# Patient Record
Sex: Female | Born: 1975 | Race: White | Hispanic: No | Smoking: Never smoker
Health system: Southern US, Community
[De-identification: ages and names within clinical notes are randomized; demographics above are authoritative.]

## PROBLEM LIST (undated history)

## (undated) HISTORY — PX: CHOLECYSTECTOMY: SHX55

---

## 2000-09-29 ENCOUNTER — Other Ambulatory Visit: Admission: RE | Admit: 2000-09-29 | Discharge: 2000-09-29 | Payer: Self-pay | Admitting: *Deleted

## 2005-02-20 ENCOUNTER — Inpatient Hospital Stay (HOSPITAL_COMMUNITY): Admission: AD | Admit: 2005-02-20 | Discharge: 2005-02-20 | Payer: Self-pay | Admitting: Obstetrics and Gynecology

## 2005-02-27 ENCOUNTER — Ambulatory Visit (HOSPITAL_COMMUNITY): Admission: RE | Admit: 2005-02-27 | Discharge: 2005-02-27 | Payer: Self-pay | Admitting: *Deleted

## 2005-03-04 ENCOUNTER — Inpatient Hospital Stay (HOSPITAL_COMMUNITY): Admission: AD | Admit: 2005-03-04 | Discharge: 2005-03-04 | Payer: Self-pay | Admitting: *Deleted

## 2005-03-07 ENCOUNTER — Inpatient Hospital Stay (HOSPITAL_COMMUNITY): Admission: AD | Admit: 2005-03-07 | Discharge: 2005-03-09 | Payer: Self-pay | Admitting: Obstetrics and Gynecology

## 2007-08-17 ENCOUNTER — Other Ambulatory Visit: Admission: RE | Admit: 2007-08-17 | Discharge: 2007-08-17 | Payer: Self-pay | Admitting: Obstetrics and Gynecology

## 2008-03-06 ENCOUNTER — Inpatient Hospital Stay (HOSPITAL_COMMUNITY): Admission: AD | Admit: 2008-03-06 | Discharge: 2008-03-06 | Payer: Self-pay | Admitting: Obstetrics and Gynecology

## 2008-03-09 ENCOUNTER — Inpatient Hospital Stay (HOSPITAL_COMMUNITY): Admission: AD | Admit: 2008-03-09 | Discharge: 2008-03-09 | Payer: Self-pay | Admitting: Obstetrics and Gynecology

## 2008-03-10 ENCOUNTER — Inpatient Hospital Stay (HOSPITAL_COMMUNITY): Admission: AD | Admit: 2008-03-10 | Discharge: 2008-03-12 | Payer: Self-pay | Admitting: Obstetrics and Gynecology

## 2008-08-11 HISTORY — PX: LAPAROSCOPIC CHOLECYSTECTOMY: SUR755

## 2009-03-28 ENCOUNTER — Other Ambulatory Visit: Admission: RE | Admit: 2009-03-28 | Discharge: 2009-03-28 | Payer: Self-pay | Admitting: Obstetrics and Gynecology

## 2009-07-31 ENCOUNTER — Inpatient Hospital Stay (HOSPITAL_COMMUNITY): Admission: EM | Admit: 2009-07-31 | Discharge: 2009-08-04 | Payer: Self-pay | Admitting: Emergency Medicine

## 2009-08-01 ENCOUNTER — Encounter (INDEPENDENT_AMBULATORY_CARE_PROVIDER_SITE_OTHER): Payer: Self-pay

## 2010-04-11 ENCOUNTER — Other Ambulatory Visit: Admission: RE | Admit: 2010-04-11 | Discharge: 2010-04-11 | Payer: Self-pay | Admitting: Obstetrics and Gynecology

## 2010-11-09 ENCOUNTER — Inpatient Hospital Stay (HOSPITAL_COMMUNITY)
Admission: AD | Admit: 2010-11-09 | Discharge: 2010-11-11 | DRG: 373 | Disposition: A | Discharge status: Home / Self Care | Payer: BC Managed Care – PPO | Source: Ambulatory Visit | Attending: Obstetrics and Gynecology | Admitting: Obstetrics and Gynecology

## 2010-11-09 ENCOUNTER — Other Ambulatory Visit: Payer: Self-pay | Admitting: Obstetrics and Gynecology

## 2010-11-09 DIAGNOSIS — O99892 Other specified diseases and conditions complicating childbirth: Secondary | ICD-10-CM | POA: Diagnosis present

## 2010-11-09 DIAGNOSIS — Z22330 Carrier of Group B streptococcus: Secondary | ICD-10-CM

## 2010-11-09 LAB — CBC
Hemoglobin: 12.9 g/dL (ref 12.0–15.0)
Platelets: 236 10*3/uL (ref 150–400)
RBC: 4.32 MIL/uL (ref 3.87–5.11)
RDW: 12.9 % (ref 11.5–15.5)

## 2010-11-10 LAB — CBC
HCT: 28.5 % — ABNORMAL LOW (ref 36.0–46.0)
Hemoglobin: 9.3 g/dL — ABNORMAL LOW (ref 12.0–15.0)
Platelets: 186 10*3/uL (ref 150–400)
RBC: 3.21 MIL/uL — ABNORMAL LOW (ref 3.87–5.11)
RDW: 13.2 % (ref 11.5–15.5)
WBC: 10.9 10*3/uL — ABNORMAL HIGH (ref 4.0–10.5)

## 2010-11-10 LAB — RPR: RPR Ser Ql: NONREACTIVE

## 2010-11-11 LAB — COMPREHENSIVE METABOLIC PANEL
ALT: 41 U/L — ABNORMAL HIGH (ref 0–35)
ALT: 749 U/L — ABNORMAL HIGH (ref 0–35)
ALT: 886 U/L — ABNORMAL HIGH (ref 0–35)
AST: 135 U/L — ABNORMAL HIGH (ref 0–37)
AST: 257 U/L — ABNORMAL HIGH (ref 0–37)
AST: 399 U/L — ABNORMAL HIGH (ref 0–37)
AST: 509 U/L — ABNORMAL HIGH (ref 0–37)
AST: 603 U/L — ABNORMAL HIGH (ref 0–37)
AST: 61 U/L — ABNORMAL HIGH (ref 0–37)
AST: 613 U/L — ABNORMAL HIGH (ref 0–37)
Albumin: 3 g/dL — ABNORMAL LOW (ref 3.5–5.2)
Albumin: 3.2 g/dL — ABNORMAL LOW (ref 3.5–5.2)
Albumin: 3.2 g/dL — ABNORMAL LOW (ref 3.5–5.2)
Albumin: 3.4 g/dL — ABNORMAL LOW (ref 3.5–5.2)
Albumin: 3.4 g/dL — ABNORMAL LOW (ref 3.5–5.2)
Albumin: 3.8 g/dL (ref 3.5–5.2)
Alkaline Phosphatase: 114 U/L (ref 39–117)
Alkaline Phosphatase: 130 U/L — ABNORMAL HIGH (ref 39–117)
Alkaline Phosphatase: 86 U/L (ref 39–117)
BUN: 1 mg/dL — ABNORMAL LOW (ref 6–23)
BUN: 12 mg/dL (ref 6–23)
BUN: 2 mg/dL — ABNORMAL LOW (ref 6–23)
BUN: 3 mg/dL — ABNORMAL LOW (ref 6–23)
BUN: <1 mg/dL — ABNORMAL LOW (ref 6–23)
CO2: 27 mEq/L (ref 19–32)
CO2: 27 mEq/L (ref 19–32)
CO2: 29 mEq/L (ref 19–32)
Calcium: 8.4 mg/dL (ref 8.4–10.5)
Calcium: 8.4 mg/dL (ref 8.4–10.5)
Calcium: 8.8 mg/dL (ref 8.4–10.5)
Calcium: 8.9 mg/dL (ref 8.4–10.5)
Chloride: 105 mEq/L (ref 96–112)
Chloride: 108 mEq/L (ref 96–112)
Chloride: 110 mEq/L (ref 96–112)
Creatinine, Ser: 0.7 mg/dL (ref 0.4–1.2)
Creatinine, Ser: 0.77 mg/dL (ref 0.4–1.2)
Creatinine, Ser: 0.77 mg/dL (ref 0.4–1.2)
Creatinine, Ser: 0.82 mg/dL (ref 0.4–1.2)
Creatinine, Ser: 0.97 mg/dL (ref 0.4–1.2)
GFR calc Af Amer: >60 mL/min (ref >60)
GFR calc Af Amer: >60 mL/min (ref >60)
GFR calc Af Amer: >60 mL/min (ref >60)
GFR calc Af Amer: >60 mL/min (ref >60)
GFR calc non Af Amer: >60 mL/min (ref >60)
GFR calc non Af Amer: >60 mL/min (ref >60)
Potassium: 3.7 mEq/L (ref 3.5–5.1)
Potassium: 4 mEq/L (ref 3.5–5.1)
Potassium: 4.3 mEq/L (ref 3.5–5.1)
Sodium: 137 mEq/L (ref 135–145)
Sodium: 141 mEq/L (ref 135–145)
Total Bilirubin: 1.2 mg/dL (ref 0.3–1.2)
Total Bilirubin: 2.9 mg/dL — ABNORMAL HIGH (ref 0.3–1.2)
Total Bilirubin: 3.2 mg/dL — ABNORMAL HIGH (ref 0.3–1.2)
Total Bilirubin: 3.4 mg/dL — ABNORMAL HIGH (ref 0.3–1.2)
Total Protein: 5.4 g/dL — ABNORMAL LOW (ref 6.0–8.3)
Total Protein: 5.6 g/dL — ABNORMAL LOW (ref 6.0–8.3)
Total Protein: 5.8 g/dL — ABNORMAL LOW (ref 6.0–8.3)
Total Protein: 5.8 g/dL — ABNORMAL LOW (ref 6.0–8.3)

## 2010-11-11 LAB — DIFFERENTIAL
Basophils Absolute: 0 10*3/uL (ref 0.0–0.1)
Basophils Absolute: 0 10*3/uL (ref 0.0–0.1)
Eosinophils Absolute: 0.2 10*3/uL (ref 0.0–0.7)
Eosinophils Relative: 4 % (ref 0–5)
Lymphocytes Relative: 39 % (ref 12–46)
Lymphs Abs: 2 10*3/uL (ref 0.7–4.0)
Monocytes Absolute: 0.7 10*3/uL (ref 0.1–1.0)
Monocytes Relative: 14 % — ABNORMAL HIGH (ref 3–12)
Neutro Abs: 2.2 10*3/uL (ref 1.7–7.7)
Neutrophils Relative %: 66 % (ref 43–77)

## 2010-11-11 LAB — CBC
HCT: 33.6 % — ABNORMAL LOW (ref 36.0–46.0)
HCT: 34.6 % — ABNORMAL LOW (ref 36.0–46.0)
HCT: 37 % (ref 36.0–46.0)
HCT: 37.7 % (ref 36.0–46.0)
HCT: 37.7 % (ref 36.0–46.0)
Hemoglobin: 11.5 g/dL — ABNORMAL LOW (ref 12.0–15.0)
Hemoglobin: 12.4 g/dL (ref 12.0–15.0)
Hemoglobin: 12.6 g/dL (ref 12.0–15.0)
MCHC: 33.8 g/dL (ref 30.0–36.0)
MCHC: 34.1 g/dL (ref 30.0–36.0)
MCHC: 34.1 g/dL (ref 30.0–36.0)
MCHC: 34.3 g/dL (ref 30.0–36.0)
MCV: 87.3 fL (ref 78.0–100.0)
MCV: 87.3 fL (ref 78.0–100.0)
MCV: 87.6 fL (ref 78.0–100.0)
MCV: 87.7 fL (ref 78.0–100.0)
MCV: 88.2 fL (ref 78.0–100.0)
Platelets: 195 10*3/uL (ref 150–400)
Platelets: 198 10*3/uL (ref 150–400)
Platelets: 201 10*3/uL (ref 150–400)
Platelets: 224 10*3/uL (ref 150–400)
Platelets: 229 10*3/uL (ref 150–400)
RBC: 3.95 MIL/uL (ref 3.87–5.11)
RBC: 3.96 MIL/uL (ref 3.87–5.11)
RBC: 4.13 MIL/uL (ref 3.87–5.11)
RBC: 4.32 MIL/uL (ref 3.87–5.11)
RDW: 12.2 % (ref 11.5–15.5)
RDW: 12.2 % (ref 11.5–15.5)
RDW: 12.5 % (ref 11.5–15.5)
WBC: 4.3 10*3/uL (ref 4.0–10.5)
WBC: 4.6 10*3/uL (ref 4.0–10.5)
WBC: 5.1 10*3/uL (ref 4.0–10.5)
WBC: 8.9 10*3/uL (ref 4.0–10.5)

## 2010-11-11 LAB — LIPASE, BLOOD
Lipase: 25 U/L (ref 11–59)
Lipase: 32 U/L (ref 11–59)
Lipase: 39 U/L (ref 11–59)

## 2010-11-11 LAB — URINALYSIS, ROUTINE W REFLEX MICROSCOPIC
Bilirubin Urine: NEGATIVE
Glucose, UA: NEGATIVE mg/dL
Protein, ur: NEGATIVE mg/dL

## 2010-11-11 LAB — AMYLASE: Amylase: 152 U/L — ABNORMAL HIGH (ref 0–105)

## 2010-12-24 NOTE — Discharge Summary (Signed)
NAMENOELIE, [name]              ACCOUNT NO.:  [id number]   MEDICAL RECORD NO.:  [id number]          PATIENT TYPE:  INP   LOCATION:  9115                          FACILITY:  [facility]   PHYSICIAN:  Charles A. Delcambre, MDDATE OF BIRTH:  06/30/1976   DATE OF ADMISSION:  03/10/2008  DATE OF DISCHARGE:                               DISCHARGE SUMMARY   CHIEF COMPLAINT:  Possible rupture of membranes and term intrauterine  pregnancy 40 weeks and 4 days.  A 35 year old gravida 2, para 1-0-0-1,  [facility] March 06, 2008, seen in return admission yesterday complaining of  some leaking all day.  The nursing  assessment was that bag of water was  palpable.  No pooling was seen and Nitrazine was equivocal.  The cervix  was checked there and was 3 cm.  I checked early in the week and called  her 3.5 cm dilated.  She was sent for AFI and AFI was normal and  discharged home.  She denies contractions at this time or bleeding other  than what was seen in the office today as described below.   PAST MEDICAL HISTORY:  Migraine headaches.   SURGICAL HISTORY:  None.  SVD x1.   MEDICATIONS:  Prenatal vitamins, iron, and calcium.   ALLERGIES:  No known drug allergies.   SOCIAL HISTORY:  No tobacco, ethanol, or drug use.  Married, monogamous  relation with her husband.   FAMILY HISTORY:  Hypertension, diabetes, osteopenia, otherwise negative  for review.   REVIEW OF SYSTEMS:  No migraine headaches.  Currently no fever, chills,  or abdominal pain.  Currently without contractions.  No further leaking  after the history given above yesterday.   PHYSICAL EXAMINATION:  VITAL SIGNS:  Blood pressure 114/64, respirations  18, pulse 90, and weight 154 pounds.  GENERAL:  She complains of some decreased fetal movement, but the baby  has awoken up later in the morning.  CORONARY:  Regular rate and rhythm 2/6 systolic ejection murmur of the  left sternal border.  LUNGS:  Clear bilaterally.  ABDOMEN:  Gravid uterus,  fundus at 40 cm, vertex to palpation.  On  vaginal exam, sterile speculum was done.  A slight pool was noted within  the friable cervix blood into the pool, so I could not get a fern and  area away from the bleeding was checked and Nitrazine was positive.  I  could not get any fluid to extrude once pushing on the abdomen, exam was  posterior 4 cm, 75% effaced, -2 station.  EXTREMITIES:  Minimal edema bilaterally.   LABORATORY:  Prenatal labs, Rh blood type A positive, antibody screen  negative, VDRL nonreactive, rubella immune, hepatitis B surface antigen  negative, HIV nonreactive, repeat HIV at 36 weeks, negative, and RPR at  28 weeks, negative.  Hemoglobin 11.6 at 28 weeks, 1 hour Glucola 113.  Group B strep is negative.  Pap, GC, and chlamydia cultures were  negative.  Cystic fibrosis was declined.  First trimester screen and  quad screen were declined as well.   ASSESSMENT:  Intrauterine pregnancy 40 weeks in 4 days, unclear rupture  of membranes and unable to document at this point, a 40 weeks and 4  days, 4-cm, we will go ahead and admit for Pitocin secondary to these  factors.  She was informed and gives consent and we will proceed.      Charles A. Sydnee Cabal, MD  Electronically Signed     CAD/MEDQ  D:  03/10/2008  T:  03/11/2008  Job:  [id number]

## 2010-12-27 NOTE — H&P (Signed)
[name], [name]              ACCOUNT NO.:  [id number]   MEDICAL RECORD NO.:  [id number]          PATIENT TYPE:  INP   LOCATION:  9168                          FACILITY:  [facility]   PHYSICIAN:  Lenoard Aden, M.D.DATE OF BIRTH:  21-Jul-1976   DATE OF ADMISSION:  03/07/2005  DATE OF DISCHARGE:                                HISTORY & PHYSICAL   CHIEF COMPLAINT:  Spontaneous rupture of membranes at 1 a.m.   HISTORY OF PRESENT ILLNESS:  She is a 35 year old white female G1, P0, EDD  March 09, 2005 who presents with spontaneous rupture of membranes in active  labor.   MEDICATIONS:  Prenatal vitamins.   ALLERGIES:  No known drug allergies.   FAMILY HISTORY:  Anemia, bronchitis, diabetes, heart disease.   SOCIAL HISTORY:  She is a nonsmoker, nondrinker.  Denies domestic or  physical violence.   Pregnancy course complicated by oligohydramnios.   PRENATAL LABORATORIES:  Blood type A+.  Rubella immune.  Hepatitis, HIV  nonreactive.  GBS is reported as being done and negative.   PHYSICAL EXAMINATION:  GENERAL:  She is a well-developed, well-nourished  white female in moderate amount of distress.  HEENT:  Normal.  LUNGS:  Clear.  HEART:  Regular rate and rhythm.  ABDOMEN:  Soft, gravid, nontender.  Estimated fetal weight 7-7.5 pounds.  PELVIC:  Cervix 3 cm, 100%, vertex, 0 to +1 station.  EXTREMITIES:  No cords.  NEUROLOGIC:  Nonfocal.   IMPRESSION:  1.  Term intrauterine pregnancy with spontaneous rupture of membranes in      active labor.  2.  History of oligohydramnios.   PLAN:  Proceed with augmentation as needed.  Anticipate attempts at vaginal  delivery.       RJT/MEDQ  D:  03/07/2005  T:  03/07/2005  Job:  [id number]

## 2011-04-15 ENCOUNTER — Other Ambulatory Visit (HOSPITAL_COMMUNITY)
Admission: RE | Admit: 2011-04-15 | Discharge: 2011-04-15 | Disposition: A | Discharge status: Home / Self Care | Payer: BC Managed Care – PPO | Source: Ambulatory Visit | Attending: Obstetrics and Gynecology | Admitting: Obstetrics and Gynecology

## 2011-04-15 ENCOUNTER — Other Ambulatory Visit: Payer: Self-pay | Admitting: Obstetrics and Gynecology

## 2011-04-15 DIAGNOSIS — Z01419 Encounter for gynecological examination (general) (routine) without abnormal findings: Secondary | ICD-10-CM | POA: Insufficient documentation

## 2011-05-09 LAB — CBC
HCT: 35.7 — ABNORMAL LOW
HCT: 30.7 — ABNORMAL LOW
Hemoglobin: 12
MCHC: 33.5
MCHC: 34.1
MCV: 88.1
MCV: 88
Platelets: 207
RBC: 4.06
WBC: 11.9 — ABNORMAL HIGH

## 2011-06-25 ENCOUNTER — Other Ambulatory Visit: Payer: Self-pay | Admitting: Family Medicine

## 2011-06-25 ENCOUNTER — Ambulatory Visit
Admission: RE | Admit: 2011-06-25 | Discharge: 2011-06-25 | Disposition: A | Discharge status: Home / Self Care | Payer: BC Managed Care – PPO | Source: Ambulatory Visit | Attending: Family Medicine | Admitting: Family Medicine

## 2012-04-15 ENCOUNTER — Other Ambulatory Visit: Payer: Self-pay | Admitting: Obstetrics and Gynecology

## 2012-04-15 ENCOUNTER — Other Ambulatory Visit (HOSPITAL_COMMUNITY)
Admission: RE | Admit: 2012-04-15 | Discharge: 2012-04-15 | Disposition: A | Discharge status: Home / Self Care | Payer: BC Managed Care – PPO | Source: Ambulatory Visit | Attending: Obstetrics and Gynecology | Admitting: Obstetrics and Gynecology

## 2012-04-15 DIAGNOSIS — Z01419 Encounter for gynecological examination (general) (routine) without abnormal findings: Secondary | ICD-10-CM | POA: Insufficient documentation

## 2012-04-21 ENCOUNTER — Ambulatory Visit: Payer: BC Managed Care – PPO | Attending: Obstetrics and Gynecology | Admitting: Physical Therapy

## 2012-04-21 DIAGNOSIS — IMO0001 Reserved for inherently not codable concepts without codable children: Secondary | ICD-10-CM | POA: Insufficient documentation

## 2012-04-21 DIAGNOSIS — M2420 Disorder of ligament, unspecified site: Secondary | ICD-10-CM | POA: Insufficient documentation

## 2012-04-21 DIAGNOSIS — M629 Disorder of muscle, unspecified: Secondary | ICD-10-CM | POA: Insufficient documentation

## 2012-04-21 DIAGNOSIS — R32 Unspecified urinary incontinence: Secondary | ICD-10-CM | POA: Insufficient documentation

## 2012-05-05 ENCOUNTER — Ambulatory Visit: Payer: BC Managed Care – PPO | Admitting: Physical Therapy

## 2012-05-11 ENCOUNTER — Ambulatory Visit: Payer: BC Managed Care – PPO | Attending: Obstetrics and Gynecology | Admitting: Physical Therapy

## 2012-05-11 DIAGNOSIS — M2420 Disorder of ligament, unspecified site: Secondary | ICD-10-CM | POA: Insufficient documentation

## 2012-05-11 DIAGNOSIS — M629 Disorder of muscle, unspecified: Secondary | ICD-10-CM | POA: Insufficient documentation

## 2012-05-11 DIAGNOSIS — IMO0001 Reserved for inherently not codable concepts without codable children: Secondary | ICD-10-CM | POA: Insufficient documentation

## 2012-05-11 DIAGNOSIS — R32 Unspecified urinary incontinence: Secondary | ICD-10-CM | POA: Insufficient documentation

## 2012-05-19 ENCOUNTER — Encounter: Payer: BC Managed Care – PPO | Admitting: Physical Therapy

## 2012-05-24 ENCOUNTER — Ambulatory Visit: Payer: BC Managed Care – PPO | Admitting: Physical Therapy

## 2012-05-25 ENCOUNTER — Encounter: Payer: BC Managed Care – PPO | Admitting: Physical Therapy

## 2012-06-21 ENCOUNTER — Ambulatory Visit: Payer: BC Managed Care – PPO | Attending: Obstetrics and Gynecology | Admitting: Physical Therapy

## 2012-06-21 DIAGNOSIS — M2420 Disorder of ligament, unspecified site: Secondary | ICD-10-CM | POA: Insufficient documentation

## 2012-06-21 DIAGNOSIS — M629 Disorder of muscle, unspecified: Secondary | ICD-10-CM | POA: Insufficient documentation

## 2012-06-21 DIAGNOSIS — R32 Unspecified urinary incontinence: Secondary | ICD-10-CM | POA: Insufficient documentation

## 2012-06-21 DIAGNOSIS — IMO0001 Reserved for inherently not codable concepts without codable children: Secondary | ICD-10-CM | POA: Insufficient documentation

## 2012-07-19 ENCOUNTER — Ambulatory Visit: Payer: BC Managed Care – PPO | Admitting: Physical Therapy

## 2013-04-12 ENCOUNTER — Other Ambulatory Visit: Payer: Self-pay | Admitting: Emergency Medicine

## 2013-04-12 ENCOUNTER — Ambulatory Visit
Admission: RE | Admit: 2013-04-12 | Discharge: 2013-04-12 | Disposition: A | Discharge status: Home / Self Care | Payer: BC Managed Care – PPO | Source: Ambulatory Visit | Attending: Emergency Medicine | Admitting: Emergency Medicine

## 2013-04-12 DIAGNOSIS — R109 Unspecified abdominal pain: Secondary | ICD-10-CM

## 2013-04-12 MED ORDER — IOHEXOL 300 MG/ML  SOLN
30.0000 mL | Freq: Once | INTRAMUSCULAR | Status: AC | PRN
  Administered 2013-04-12: 30 mL via ORAL

## 2013-04-12 MED ORDER — IOHEXOL 300 MG/ML  SOLN
100.0000 mL | Freq: Once | INTRAMUSCULAR | Status: AC | PRN
  Administered 2013-04-12: 100 mL via INTRAVENOUS

## 2013-05-12 ENCOUNTER — Other Ambulatory Visit: Payer: Self-pay | Admitting: Obstetrics and Gynecology

## 2013-05-12 ENCOUNTER — Other Ambulatory Visit (HOSPITAL_COMMUNITY)
Admission: RE | Admit: 2013-05-12 | Discharge: 2013-05-12 | Disposition: A | Discharge status: Home / Self Care | Payer: BC Managed Care – PPO | Source: Ambulatory Visit | Attending: Obstetrics and Gynecology | Admitting: Obstetrics and Gynecology

## 2013-05-12 DIAGNOSIS — Z1151 Encounter for screening for human papillomavirus (HPV): Secondary | ICD-10-CM | POA: Insufficient documentation

## 2013-05-12 DIAGNOSIS — Z01419 Encounter for gynecological examination (general) (routine) without abnormal findings: Secondary | ICD-10-CM | POA: Insufficient documentation

## 2014-05-16 ENCOUNTER — Other Ambulatory Visit: Payer: Self-pay | Admitting: Obstetrics and Gynecology

## 2014-05-16 ENCOUNTER — Other Ambulatory Visit (HOSPITAL_COMMUNITY)
Admission: RE | Admit: 2014-05-16 | Discharge: 2014-05-16 | Disposition: A | Discharge status: Home / Self Care | Payer: BC Managed Care – PPO | Source: Ambulatory Visit | Attending: Obstetrics and Gynecology | Admitting: Obstetrics and Gynecology

## 2014-05-16 DIAGNOSIS — Z01419 Encounter for gynecological examination (general) (routine) without abnormal findings: Secondary | ICD-10-CM | POA: Insufficient documentation

## 2014-05-18 LAB — CYTOLOGY - PAP

## 2014-12-19 ENCOUNTER — Emergency Department (HOSPITAL_COMMUNITY): Payer: BLUE CROSS/BLUE SHIELD

## 2014-12-19 ENCOUNTER — Encounter (HOSPITAL_COMMUNITY): Payer: Self-pay | Admitting: Physical Medicine and Rehabilitation

## 2014-12-19 ENCOUNTER — Emergency Department (HOSPITAL_COMMUNITY)
Admission: EM | Admit: 2014-12-19 | Discharge: 2014-12-19 | Disposition: A | Discharge status: Home / Self Care | Payer: BLUE CROSS/BLUE SHIELD | Attending: Emergency Medicine | Admitting: Emergency Medicine

## 2014-12-19 DIAGNOSIS — R259 Unspecified abnormal involuntary movements: Secondary | ICD-10-CM | POA: Diagnosis not present

## 2014-12-19 DIAGNOSIS — R252 Cramp and spasm: Secondary | ICD-10-CM | POA: Diagnosis present

## 2014-12-19 DIAGNOSIS — R251 Tremor, unspecified: Secondary | ICD-10-CM | POA: Diagnosis not present

## 2014-12-19 LAB — BASIC METABOLIC PANEL
Anion gap: 9 (ref 5–15)
BUN: 17 mg/dL (ref 6–20)
CO2: 25 mmol/L (ref 22–32)
Calcium: 9.4 mg/dL (ref 8.9–10.3)
Chloride: 104 mmol/L (ref 101–111)
Creatinine, Ser: 0.86 mg/dL (ref 0.44–1.00)
GFR calc Af Amer: >60 mL/min (ref >60)
Glucose, Bld: 113 mg/dL — ABNORMAL HIGH (ref 70–99)
Potassium: 4.4 mmol/L (ref 3.5–5.1)
SODIUM: 138 mmol/L (ref 135–145)

## 2014-12-19 LAB — CBC WITH DIFFERENTIAL/PLATELET
BASOS PCT: 1 % (ref 0–1)
Basophils Absolute: 0.1 10*3/uL (ref 0.0–0.1)
EOS ABS: 0.2 10*3/uL (ref 0.0–0.7)
EOS PCT: 2 % (ref 0–5)
HCT: 39.9 % (ref 36.0–46.0)
HEMOGLOBIN: 13.1 g/dL (ref 12.0–15.0)
Lymphocytes Relative: 26 % (ref 12–46)
Lymphs Abs: 2.4 10*3/uL (ref 0.7–4.0)
MCH: 28.5 pg (ref 26.0–34.0)
MCHC: 32.8 g/dL (ref 30.0–36.0)
MCV: 86.7 fL (ref 78.0–100.0)
MONOS PCT: 9 % (ref 3–12)
Monocytes Absolute: 0.8 10*3/uL (ref 0.1–1.0)
NEUTROS PCT: 62 % (ref 43–77)
Neutro Abs: 5.8 10*3/uL (ref 1.7–7.7)
PLATELETS: 270 10*3/uL (ref 150–400)
RBC: 4.6 MIL/uL (ref 3.87–5.11)
RDW: 12.3 % (ref 11.5–15.5)
WBC: 9.3 10*3/uL (ref 4.0–10.5)

## 2014-12-19 LAB — TSH: TSH: 2.392 u[IU]/mL (ref 0.350–4.500)

## 2014-12-19 MED ORDER — DIAZEPAM 5 MG PO TABS: 5.0000 mg | ORAL_TABLET | Freq: Once | ORAL | Status: DC

## 2014-12-19 MED ORDER — LORAZEPAM 2 MG/ML IJ SOLN
1.0000 mg | Freq: Once | INTRAMUSCULAR | Status: AC
  Administered 2014-12-19: 1 mg via INTRAVENOUS
  Filled 2014-12-19: qty 1

## 2014-12-19 MED ORDER — GADOBENATE DIMEGLUMINE 529 MG/ML IV SOLN
10.0000 mL | Freq: Once | INTRAVENOUS | Status: AC | PRN
  Administered 2014-12-19: 10 mL via INTRAVENOUS

## 2014-12-19 MED ORDER — DIAZEPAM 5 MG PO TABS
5.0000 mg | ORAL_TABLET | Freq: Once | ORAL | Status: AC
  Administered 2014-12-19: 5 mg via ORAL
  Filled 2014-12-19: qty 1

## 2014-12-19 MED ORDER — CLONAZEPAM 0.5 MG PO TABS
0.5000 mg | ORAL_TABLET | Freq: Three times a day (TID) | ORAL | Status: AC | PRN
Start: 2014-12-19 — End: ?

## 2014-12-19 NOTE — ED Notes (Signed)
Twitching to L shoulder still present

## 2014-12-19 NOTE — ED Notes (Signed)
Pt c/o spasms in L hand radiating into L shoulder. Was seen at an urgent care yesterday and told to follow up in the ER if symptoms worsened. Pt c/o increased pain in shoulder blade from spasms; sts  "feels like it's grinding." Denies injury to arm; Reports having a massage on Sunday. No other neuro deficits noted.

## 2014-12-19 NOTE — ED Notes (Signed)
Neurologist at bedside. 

## 2014-12-19 NOTE — Consult Note (Signed)
Consult Reason for Consult: abnormal involuntary movement Referring Physician: Dr Fayrene FearingJames [facility]  CC: abnormal involuntary movements  HPI: [name] is an 39 y.o. female who presents to the Emergency Department complaining of constant left sided abnormal movements since yesterday morning. Symptoms started as a left hand tremor (per description both rest and action). As time went on tremor resolved and she now has continuous rotatory movements of her left shoulder. These movements are non-stop, she notes the development of shoulder pain and muscle spasms due to constant movement. Denies any weakness or sensory deficits. Nothing makes the movements worse. Unclear if they resolve with sleep. States that her friend will massage her left shoulder (mainly in scapular region) and the movements will pause for 10-30s and then resolve. No forced deviation of arm or head.   No prior history of symptoms. Patients father has PD, developed symptoms in his 6860s. She notes he has similar movements to what she is doing now. No recent illness or medication changes. No increased stress or anxiety. No drug use.    History reviewed. No pertinent past medical history.  History reviewed. No pertinent past surgical history.  No family history on file.  Social History:  reports that she has never smoked. She does not have any smokeless tobacco history on file. She reports that she does not drink alcohol or use illicit drugs.  No Known Allergies  Medications: I have reviewed the patient's current medications.  ROS: Out of a complete 14 system review, the patient complains of only the following symptoms, and all other reviewed systems are negative. +abnormal movements, shoulder pain Physical Examination: Filed Vitals:   12/19/14 1739  BP: 124/85  Pulse: 78  Temp: 98.6 F (37 C)  Resp: 20   Physical Exam  Constitutional: He appears well-developed and well-nourished.  Psych: Affect appropriate to  situation Eyes: No scleral injection HENT: No OP obstrucion Head: Normocephalic.  Cardiovascular: Normal rate and regular rhythm.  Respiratory: Effort normal and breath sounds normal.  GI: Soft. Bowel sounds are normal. No distension. There is no tenderness.  Skin: WDI  Neurologic Examination Mental Status: Alert, oriented, thought content appropriate.  Speech fluent without evidence of aphasia.  No dysarthria. Able to follow 3 step commands without difficulty. Cranial Nerves: II: funduscopic exam wnl bilaterally, visual fields grossly normal, pupils equal, round, reactive to light and accommodation III,IV, VI: ptosis not present, extra-ocular motions intact bilaterally V,VII: smile symmetric, facial light touch sensation normal bilaterally VIII: hearing normal bilaterally IX,X: gag reflex present XI: trapezius strength/neck flexion strength normal bilaterally XII: tongue strength normal  Motor: Right : Upper extremity    Left:     Upper extremity 5/5 deltoid       5/5 deltoid 5/5 biceps      5/5 biceps  5/5 triceps      5/5 triceps 5/5 hand grip      5/5 hand grip  Lower extremity     Lower extremity 5/5 hip flexor      5/5 hip flexor 5/5 quadricep      5/5 quadriceps  5/5 hamstrings     5/5 hamstrings 5/5 plantar flexion       5/5 plantar flexion 5/5 plantar extension     5/5 plantar extension Noted continuous rotatory movements of left shoulder. Movements will change direction and frequency. Appear distractable, will stop with discussion or distraction. Mild entranement to tapping rhythm of alternative hand. No dystonic posturing noted.  Tone and bulk:normal tone throughout; no  atrophy noted Sensory: Pinprick and light touch intact throughout, bilaterally Deep Tendon Reflexes: 2+ and symmetric throughout Plantars: Right: downgoing   Left: downgoing Cerebellar: normal finger-to-nose, and normal heel-to-shin test Gait: normal gait and station  Laboratory Studies:    Basic Metabolic Panel:  Recent Labs Lab 12/19/14 1757  NA 138  K 4.4  CL 104  CO2 25  GLUCOSE 113*  BUN 17  CREATININE 0.86  CALCIUM 9.4    Liver Function Tests: No results for input(s): AST, ALT, ALKPHOS, BILITOT, PROT, ALBUMIN in the last 168 hours. No results for input(s): LIPASE, AMYLASE in the last 168 hours. No results for input(s): AMMONIA in the last 168 hours.  CBC:  Recent Labs Lab 12/19/14 1757  WBC 9.3  NEUTROABS 5.8  HGB 13.1  HCT 39.9  MCV 86.7  PLT 270    Cardiac Enzymes: No results for input(s): CKTOTAL, CKMB, CKMBINDEX, TROPONINI in the last 168 hours.  BNP: Invalid input(s): POCBNP  CBG: No results for input(s): GLUCAP in the last 168 hours.  Microbiology: No results found for this or any previous visit.  Coagulation Studies: No results for input(s): LABPROT, INR in the last 72 hours.  Urinalysis: No results for input(s): COLORURINE, LABSPEC, PHURINE, GLUCOSEU, HGBUR, BILIRUBINUR, KETONESUR, PROTEINUR, UROBILINOGEN, NITRITE, LEUKOCYTESUR in the last 168 hours.  Invalid input(s): APPERANCEUR  Lipid Panel:  No results found for: CHOL, TRIG, HDL, CHOLHDL, VLDL, LDLCALC  HgbA1C: No results found for: HGBA1C  Urine Drug Screen:  No results found for: LABOPIA, COCAINSCRNUR, LABBENZ, AMPHETMU, THCU, LABBARB  Alcohol Level: No results for input(s): ETH in the last 168 hours.  Other results:  Imaging: No results found.   Assessment/Plan:  39y/o woman with unremarkable past medical history presenting for evaluation of abnormal involuntary movements of LUE. Unclear etiology. Will check MRI brain and C spine to rule out central process. Will check TSH and ANA. If MRI workup is negative may benefit from EMG/NCS as outpatient. Exam findings raise possibility of functional movement disorder.  -MRI brain and C spine with and without contrast -check TSH and ANA -if MRI unremarkable then EMG/NCS and outpatient neurology follow up -low dose  klonopin for symptomatic relief  Elspeth ChoPeter Loranzo Desha, DO [facility] [phone]  If 7pm- 7am, please page neurology on call as listed in AMION. 12/19/2014, 7:33 PM   ,t

## 2014-12-19 NOTE — Discharge Instructions (Signed)
Read the information below.  Use the prescribed medication as directed.  Please discuss all new medications with your pharmacist.  You may return to the Emergency Department at any time for worsening condition or any new symptoms that concern you.  If there is any possibility that you might be pregnant, please let your health care provider know and discuss this with the pharmacist to ensure medication safety.   If you develop fevers, loss of control of bowel or bladder, weakness or numbness in your arms or legs, or are unable to walk, return to the ER for a recheck.    Dystonias The dystonias are movement disorders in which sustained muscle contractions cause twisting and repetitive movements or abnormal postures. The movements, which are involuntary and sometimes painful, may affect a single muscle; a group of muscles such as those in the arms, legs, or neck; or the entire body. Early symptoms (problems) may include a deterioration in handwriting after writing several lines, foot cramps, and a tendency of one foot to pull up or drag after running or walking some distance. Other possible symptoms are tremor and voice or speech difficulties. Birth injury (particularly due to lack of oxygen), certain infections, reactions to certain drugs, heavy-metal or carbon monoxide poisoning, trauma (damage caused by an accident), or stroke can cause dystonic symptoms. About half the cases of dystonia have no connection to disease or injury and are called primary or idiopathic dystonia. Of the primary dystonias, many cases appear to be inherited in a dominant manner. Dystonias can also be symptoms of other diseases, some of which may be hereditary (passed down from parents). In some individuals, symptoms of a dystonia appear spontaneously in childhood between the ages of 19 and 21, usually in the foot or in the hand. For other individuals, the symptoms emerge in late adolescence or early adulthood. TREATMENT  No one  treatment has been found universally effective for dystonia. Instead, physicians use a variety of therapies (medications, surgery and other treatments such as physical therapy, splinting, stress management, and biofeedback), aimed at reducing or eliminating muscle spasms and pain. Since response to drugs varies among patients and even in the same person over time, the therapy must be individualized. PROGNOSIS The initial symptoms can be very mild and may be noticeable only after prolonged exertion, stress, or fatigue. Over a period of time, the symptoms may become more noticeable and widespread and be unrelenting; sometimes, however, there is little or no progression. RESEARCH BEING DONE Investigators believe that the dystonias result from an abnormality in an area of the brain called the basal ganglia, where some of the messages that initiate muscle contractions are processed. Scientists suspect a defect in the body's ability to process a group of chemicals called neurotransmitters that help cells in the brain communicate with each other. Scientists at the [facility] [facility] laboratories have conducted detailed investigations of the pattern of muscle activity in persons with dystonias. Studies using EEG analysis and neuroimaging are probing brain activity. The search for the gene or genes responsible for some forms of dominantly inherited dystonias continues. 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 In 1997, the team sequenced the DYT1 gene and found that it codes for a previously unknown protein now called "torsin A." Document Released: 07/18/2002 Document Revised: 10/20/2011 Document Reviewed: 09/21/2013 [facility] [employer] Patient Information 2015 [place], [place]. This information is not intended to replace advice given to you by your health  care provider. Make sure you discuss any questions you have with your health care provider.

## 2014-12-19 NOTE — ED Provider Notes (Signed)
CSN: [id number]     Arrival date & time 12/19/14  1434 History  This chart was scribed for non-physician practitioner, Trixie DredgeEmily Kerstie Agent, PA-C, working with Geoffery Lyonsouglas Delo, MD, by Lionel DecemberHatice Demirci, ED Scribe. This patient was seen in room TR01C/TR01C and the patient's care was started at 5:13 PM.   First MD Initiated Contact with Patient 12/19/14 1701     Chief Complaint  Patient presents with  . Spasms     (Consider location/radiation/quality/duration/timing/severity/associated sxs/prior Treatment) The history is provided by the patient.    HPI Comments: [name] is a 39 y.o. female who presents to the Emergency Department complaining of constant uncontrolled left arm movement.  The symptoms started as left sided hand tremors onset yesterday morning, this is currently only present while standing.  Today she developed this motion of her left shoulder.   She had associated symptoms of numbness and tingling yesterday and states that her shoulder feels very sore due to the grinding that occurs when she gets the spasm. She denies any new medications, recent illnesses, fevers, chills or any other symptoms.  Patient had a massage two days ago and works out frequently (says that these are the only things that may be causing her symptoms.)  Per friend, she has tried pressing on certain spots on her shoulder which caused the spasms to stop for about 30 seconds.  She has not traveled to any new countries recently. Patient has seasonal allergies.  She has no other complaints today.    History  Substance Use Topics  . Smoking status: Never Smoker   . Smokeless tobacco: Not on file  . Alcohol Use: No   OB History    No data available     Review of Systems  Constitutional: Negative for fever and chills.  HENT: Negative for congestion, nosebleeds, postnasal drip, rhinorrhea and sore throat.   Eyes: Negative for pain, discharge, redness and itching.  Gastrointestinal: Negative for nausea and vomiting.   Musculoskeletal: Negative for neck pain and neck stiffness.  Skin: Negative for rash.  Neurological: Positive for tremors.  All other systems reviewed and are negative.     Allergies  Review of patient's allergies indicates no known allergies.  Home Medications   Prior to Admission medications   Not on File   BP 132/81 mmHg  Pulse 72  Temp(Src) 98.1 F (36.7 C) (Oral)  Resp 18  SpO2 97% Physical Exam  Constitutional: She appears well-developed and well-nourished. No distress.  HENT:  Head: Normocephalic and atraumatic.  Eyes: Conjunctivae are normal.  Neck: Normal range of motion. Neck supple.  Cardiovascular: Normal rate.   Pulmonary/Chest: Effort normal.  Musculoskeletal: Normal range of motion. She exhibits no edema or tenderness.  LEFT upper extremity:  Nearly constant repetitive patterned movement of the left shoulder. Full active range of motion of the shoulder.  No tenderness to palpation. Distal pulses intact. Sensation intact.    Neurological: She is alert.  Skin: She is not diaphoretic.  Psychiatric: She has a normal mood and affect. Her behavior is normal. Thought content normal.  Nursing note and vitals reviewed.   ED Course  Procedures (including critical care time) DIAGNOSTIC STUDIES: Oxygen Saturation is 97% on RA, normal by my interpretation.    COORDINATION OF CARE: 5:19 PM Discussed treatment plan with patient at beside, the patient agrees with the plan and has no further questions at this time.   5:28 PM Discussed patient and plan with Dr. Fayrene FearingJames.   Labs Review Labs Reviewed  BASIC METABOLIC PANEL - Abnormal; Notable for the following:    Glucose, Bld 113 (*)    All other components within normal limits  CBC WITH DIFFERENTIAL/PLATELET  TSH    Imaging Review Mr Laqueta Jean Wo Contrast  12/19/2014   CLINICAL DATA:  LEFT-sided tremor and abnormal movements beginning yesterday, associated with pain/spasm. Assess involuntary movements.  EXAM:  MRI HEAD WITHOUT AND WITH CONTRAST  MRI CERVICAL SPINE WITHOUT AND WITH CONTRAST  TECHNIQUE: Multiplanar, multiecho pulse sequences of the brain and surrounding structures, and cervical spine, to include the craniocervical junction and cervicothoracic junction, were obtained without and with intravenous contrast.  CONTRAST:  10mL MULTIHANCE GADOBENATE DIMEGLUMINE 529 MG/ML IV SOLN  COMPARISON:  None.  FINDINGS: MRI HEAD FINDINGS  The ventricles and sulci are normal for patient's age. No abnormal parenchymal signal, mass lesions, mass effect. No abnormal parenchymal enhancement. RIGHT greater the than the LEFT inferior basal ganglia perivascular spaces. No reduced diffusion to suggest acute ischemia. No susceptibility artifact to suggest hemorrhage.  No abnormal extra-axial fluid collections. Mildly prominent supracerebellar cistern may reflect small arachnoid cyst. No extra-axial masses nor leptomeningeal enhancement. Normal major intracranial vascular flow voids seen at the skull base.  Ocular globes and orbital contents are unremarkable though not tailored for evaluation. No abnormal sellar expansion. Small LEFT maxillary mucosal retention cyst. Visualized paranasal sinuses and mastoid air cells are otherwise well-aerated. No suspicious calvarial bone marrow signal. No abnormal sellar expansion. Craniocervical junction maintained.  MRI CERVICAL SPINE FINDINGS  Cervical vertebral bodies and posterior elements are intact and aligned with maintenance of the cervical lordosis. Intervertebral discs demonstrate normal morphology and signal characteristics. No STIR signal abnormality to suggest acute osseous process. No abnormal osseous nor intradiscal enhancement.  Cervical spinal cord is normal in morphology and signal characteristics from the cervical medullary junction to the level of T2-3, the most caudal well visualized level. No abnormal cord, leptomeningeal nor epidural enhancement. Included prevertebral and  paraspinal soft tissues are normal.  Level by level evaluation:  C2-3, C3-4, C4-5: No disc bulge, canal stenosis nor neural foraminal narrowing.  C5-6: 1-2 mm central disc protrusion without canal stenosis or neural foraminal narrowing.  C6-7, C7-T1: No disc bulge, canal stenosis nor neural foraminal narrowing.  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  Tiny central C5-6 disc protrusion, otherwise unremarkable MRI of the cervical spine with and without contrast.   Electronically Signed   By: Awilda Metro   On: 12/19/2014 22:17   Mr Cervical Spine W Wo Contrast  12/19/2014   CLINICAL DATA:  LEFT-sided tremor and abnormal movements beginning yesterday, associated with pain/spasm. Assess involuntary movements.  EXAM: MRI HEAD WITHOUT AND WITH CONTRAST  MRI CERVICAL SPINE WITHOUT AND WITH CONTRAST  TECHNIQUE: Multiplanar, multiecho pulse sequences of the brain and surrounding structures, and cervical spine, to include the craniocervical junction and cervicothoracic junction, were obtained without and with intravenous contrast.  CONTRAST:  10mL MULTIHANCE GADOBENATE DIMEGLUMINE 529 MG/ML IV SOLN  COMPARISON:  None.  FINDINGS: MRI HEAD FINDINGS  The ventricles and sulci are normal for patient's age. No abnormal parenchymal signal, mass lesions, mass effect. No abnormal parenchymal enhancement. RIGHT greater the than the LEFT inferior basal ganglia perivascular spaces. No reduced diffusion to suggest acute ischemia. No susceptibility artifact to suggest hemorrhage.  No abnormal extra-axial fluid collections. Mildly prominent supracerebellar cistern may reflect small arachnoid cyst. No extra-axial masses nor leptomeningeal enhancement. Normal major  intracranial vascular flow voids seen at the skull base.  Ocular globes and orbital contents are unremarkable though not tailored for evaluation. No abnormal  sellar expansion. Small LEFT maxillary mucosal retention cyst. Visualized paranasal sinuses and mastoid air cells are otherwise well-aerated. No suspicious calvarial bone marrow signal. No abnormal sellar expansion. Craniocervical junction maintained.  MRI CERVICAL SPINE FINDINGS  Cervical vertebral bodies and posterior elements are intact and aligned with maintenance of the cervical lordosis. Intervertebral discs demonstrate normal morphology and signal characteristics. No STIR signal abnormality to suggest acute osseous process. No abnormal osseous nor intradiscal enhancement.  Cervical spinal cord is normal in morphology and signal characteristics from the cervical medullary junction to the level of T2-3, the most caudal well visualized level. No abnormal cord, leptomeningeal nor epidural enhancement. Included prevertebral and paraspinal soft tissues are normal.  Level by level evaluation:  C2-3, C3-4, C4-5: No disc bulge, canal stenosis nor neural foraminal narrowing.  C5-6: 1-2 mm central disc protrusion without canal stenosis or neural foraminal narrowing.  C6-7, C7-T1: No disc bulge, canal stenosis nor neural foraminal narrowing.  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  Tiny central C5-6 disc protrusion, otherwise unremarkable MRI of the cervical spine with and without contrast.   Electronically Signed   By: Awilda Metroourtnay  Bloomer   On: 12/19/2014 22:17     EKG Interpretation None       Discussed pt with Dr Fayrene FearingJames.   7:02 PM I spoke with Dr Amada JupiterKirkpatrick, pt will be seen by oncoming neurologist at 7:00pm.   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  Seen by neurology, suspicion for functional etiology.  Please see Dr Minus BreedingSumner's note for complete details.  MRI brain/c-spine without acute findings-  reviewed MRI findings with Dr Fayrene FearingJames.  TSH, ANA pending.  Dr Hosie PoissonSumner has arranged outpatient neurologic follow up and has recommended klonopin for symptom management.   After valium and ativan, pt's symptoms have decreased - in fact when I came to discuss MRI results for her, I witnessed her shoulder was without abnormal movements for 1-2 minutes.   D/C home with klonopin, outpt neurology follow up.  Discussed result, findings, treatment, and follow up  with patient.  Pt given return precautions.  Pt verbalizes understanding and agrees with plan.       I personally performed the services described in this documentation, which was scribed in my presence. The recorded information has been reviewed and is accurate.    Trixie Dredgemily Prachi Oftedahl, PA-C 12/19/14 2325  Rolland PorterMark James, MD 12/25/14 [phone]

## 2014-12-19 NOTE — ED Notes (Signed)
Pt reports muscle spasms and tremors to L hand and shoulder. Was seen at urgent care for same, symptoms became worse today. Denies pain upon arrival. She is alert and oriented x4. No neurological deficits noted. Ambulatory to triage.

## 2014-12-21 ENCOUNTER — Encounter (INDEPENDENT_AMBULATORY_CARE_PROVIDER_SITE_OTHER): Payer: Self-pay

## 2014-12-21 ENCOUNTER — Ambulatory Visit (INDEPENDENT_AMBULATORY_CARE_PROVIDER_SITE_OTHER): Payer: BLUE CROSS/BLUE SHIELD | Admitting: Neurology

## 2014-12-21 ENCOUNTER — Encounter: Payer: Self-pay | Admitting: Neurology

## 2014-12-21 VITALS — BP 118/72 | HR 62 | Resp 12 | Ht 62.0 in | Wt 113.0 lb

## 2014-12-21 DIAGNOSIS — R259 Unspecified abnormal involuntary movements: Secondary | ICD-10-CM | POA: Diagnosis not present

## 2014-12-21 NOTE — Progress Notes (Signed)
Subjective:    Patient ID: [name] is a 39 y.o. female.  HPI     Suzanne FoleySaima Omarius Grantham, MD, PhD [facility] [address], [address] [address] [place], [place] [id number]  [name] is a 39 year old right-handed woman who presents for initial consultation after her ER visit and as a referral from the emergency room. The patient is accompanied by her husband today. She has a benign underlying medical history and presented to the emergency room on 12/19/2014 with new onset left hand tremor as well as left shoulder involuntary rotational constant movements. I reviewed the emergency room records as well as the neurological consultation note from Dr. Hosie PoissonSumner from 12/19/2014. Symptoms started gradually and were continual. Massaging the shoulder area on the left stop the movements for about a minute first less with resumption of involuntary movements. There was no obvious trigger. On examination she had rotational abnormal movements as described by Dr. Hosie PoissonSumner. Workup included labs including TSH, CBC with differential, and BMP all of which were unremarkable, with glucose level at 113. 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 Tiny central C5-6 disc protrusion, otherwise unremarkable MRI of the cervical spine with and without contrast. In addition, personally reviewed the images through the PACS system and agree with the findings. In the emergency room she was treated with Valium and Ativan after which her symptoms improved. She was given a prescription for clonazepam. Currently, she reports that since taking the clonazepam she has had less movements. She took the first dose the night before yesterday which was a whole pill. Last night she took half a pill because she needed to  work today. She feels that the intensity and frequency of the shoulder twitching on the left and abnormal rotating shoulder movements on the left have improved. She never lost consciousness or had any other twitching. It does not affect her right side or the left lower extremity. She has no family history of involuntary movements. Her father has Parkinson's disease.   Her Past Medical History Is Significant For: No past medical history on file.  Her Past Surgical History Is Significant For: Past Surgical History  Procedure Laterality Date  . Cholecystectomy      Her Family History Is Significant For: Family History  Problem Relation Age of Onset  . Parkinson's disease Father   . Diabetes Maternal Grandfather     Her Social History Is Significant For: History   Social History  . Marital Status: Married    Spouse Name: N/A  . Number of Children: 3  . Years of Education: BS   Occupational History  . [employer]     Social History Main Topics  . Smoking status: Never Smoker   . Smokeless tobacco: Not on file  . Alcohol Use: No  . Drug Use: No  . Sexual Activity: Not on file   Other Topics Concern  . None   Social History Narrative   No caffeine use     Her Allergies Are:  No Known Allergies:   Her Current Medications Are:  Outpatient Encounter Prescriptions as of 12/21/2014  Medication Sig  . clonazePAM (KLONOPIN) 0.5 MG tablet Take 1 tablet (0.5 mg total) by mouth 3 (three) times daily as needed (abnormal muscle movements).   No facility-administered encounter medications on file  as of 12/21/2014.  :   Review of Systems:  Out of a complete 14 point review of systems, all are reviewed and negative with the exception of these symptoms as listed below:   Review of Systems  HENT: Positive for rhinorrhea.   Allergic/Immunologic: Positive for environmental allergies.  Neurological:       L hand and shoulder tremor started Monday    Objective:  Neurologic  Exam  Physical Exam Physical Examination:   Filed Vitals:   12/21/14 1428  BP: 118/72  Pulse: 62  Resp: 12    General Examination: The patient is a very pleasant 39 y.o. female in no acute distress. She appears well-developed and well-nourished and well groomed.   HEENT: Normocephalic, atraumatic, pupils are equal, round and reactive to light and accommodation. Funduscopic exam is normal with sharp disc margins noted. Extraocular tracking is good without limitation to gaze excursion or nystagmus noted. Normal smooth pursuit is noted. Hearing is grossly intact. Tympanic membranes are clear bilaterally. Face is symmetric with normal facial animation and normal facial sensation. Speech is clear with no dysarthria noted. There is no hypophonia. There is no lip, neck/head, jaw or voice tremor. She has no abnormal facial grimacing. Neck is supple with full range of passive and active motion. There are no carotid bruits on auscultation. Oropharynx exam reveals: mild mouth dryness, good dental hygiene and no significant airway crowding. Mallampati is class I. Tongue protrudes centrally and palate elevates symmetrically.   Chest: Clear to auscultation without wheezing, rhonchi or crackles noted.  Heart: S1+S2+0, regular and normal without murmurs, rubs or gallops noted.   Abdomen: Soft, non-tender and non-distended with normal bowel sounds appreciated on auscultation.  Extremities: There is no pitting edema in the distal lower extremities bilaterally. Pedal pulses are intact.  Skin: Warm and dry without trophic changes noted. There are no varicose veins.  Musculoskeletal: exam reveals no obvious joint deformities, tenderness or joint swelling or erythema.   Neurologically:  Mental status: The patient is awake, alert and oriented in all 4 spheres. Her immediate and remote memory, attention, language skills and fund of knowledge are appropriate. There is no evidence of aphasia, agnosia, apraxia or  anomia. Speech is clear with normal prosody and enunciation. Thought process is linear. Mood is normal and affect is normal.  Cranial nerves II - XII are as described above under HEENT exam. In addition: shoulder shrug is normal with equal shoulder height noted. Motor exam: Normal bulk, strength and tone is noted. There is no drift, tremor or rebound. She has an intermittent left shoulder rotational movement. It is somewhat erratic and frequency and distractible. Romberg is negative. Reflexes are 2+ throughout. Babinski: Toes are flexor bilaterally. Fine motor skills and coordination: intact with normal finger taps, normal hand movements, normal rapid alternating patting, normal foot taps and normal foot agility.  Cerebellar testing: No dysmetria or intention tremor on finger to nose testing. Heel to shin is unremarkable bilaterally. There is no truncal or gait ataxia.  Sensory exam: intact to light touch in the upper and lower extremities.  Gait, station and balance: She stands easily. No veering to one side is noted. No leaning to one side is noted. Posture is age-appropriate and stance is narrow based. Gait shows normal stride length and normal pace. No problems turning are noted. She turns en bloc. Tandem walk is unremarkable. Intact toe and heel stance is noted.               Assessment  and plan:   In summary, Suzanne Roysrica A Borner is a very pleasant 39 y.o.-year old female with a benign medical history, who presents with new onset involuntary left shoulder rotational movements which are intermittent. These have improved. She has a fairly benign exam. She has an unusual left shoulder rotational movement intermittently. This is distractible. She has an otherwise nonfocal exam. She has no evidence of dystonia, myoclonus, athetosis, or parkinsonism. I reassured her that she does not have any evidence of parkinsonism. At this juncture, I suggested no new medications. She can certainly use the clonazepam as  needed. I would not recommend this on a daily basis. I would like to proceed with further blood work including ceruloplasmin level, copper level, inflammatory markers and autoimmune antibodies. We will also do an EEG and call her with all her test results. I will see her routinely in a few months, and we will keep her posted as to her test results. I talked her the patient and her husband at length today. She is advised to stay well hydrated and well rested.   I answered all their questions today and the patient and her husband were in agreement with the above outlined plan. I would like to see the patient back in 3 months, sooner if the need arises and encouraged them to call with any interim questions or concerns.

## 2014-12-21 NOTE — Patient Instructions (Signed)
We will do blood work and an EEG (brain wave test) and call you with the results. Your neurological exam looks good.

## 2014-12-22 ENCOUNTER — Telehealth: Payer: Self-pay

## 2014-12-22 NOTE — Telephone Encounter (Signed)
I spoke to patient. She is aware of results and recommendation. She voices understanding

## 2014-12-22 NOTE — Progress Notes (Signed)
Quick Note:  Pls call pt: Labs so far look good. A couple of things are pending and we will inform her as we get those back. Vitamin D borderline low and it may not be a bad idea to start taking over-the-counter vitamin D supplement, 1000-2000 units daily of any over-the-counter vitamin D supplement of her choice may suffice. She can have her vitamin D level rechecked by her primary care physician in about 3 months. Hemoglobin A1c was 5.9 which is a diabetes marker and indicates no evidence of diabetes but perhaps a slightly increased risk for diabetes. Watching what she eats and watching her weight will help. She can discuss this with her primary care physician as well. Huston FoleySaima Braun Rocca, MD, PhD [facility] ([facility]  ______

## 2014-12-22 NOTE — Telephone Encounter (Signed)
-----   Message from Huston FoleySaima Athar, MD sent at 12/22/2014  9:28 AM EDT ----- Pls call pt: Labs so far look good. A couple of things are pending and we will inform her as we get those back. Vitamin D borderline low and it may not be a bad idea to start taking over-the-counter vitamin D supplement, 1000-2000 units daily of any over-the-counter vitamin D supplement of her choice may suffice. She can have her vitamin D level rechecked by her primary care physician in about 3 months. Hemoglobin A1c was 5.9 which is a diabetes marker and indicates no evidence of diabetes but perhaps a slightly increased risk for diabetes. Watching what she eats and watching her weight will help. She can discuss this with her primary care physician as well. Huston FoleySaima Athar, MD, PhD [facility]

## 2014-12-23 LAB — CERULOPLASMIN: CERULOPLASMIN: 23.7 mg/dL (ref 19.0–39.0)

## 2014-12-23 LAB — AMMONIA: Ammonia: 71 ug/dL (ref 19–87)

## 2014-12-23 LAB — SEDIMENTATION RATE: SED RATE: 2 mm/h (ref 0–32)

## 2014-12-23 LAB — RPR: RPR: NONREACTIVE

## 2014-12-23 LAB — VITAMIN D 25 HYDROXY (VIT D DEFICIENCY, FRACTURES): Vit D, 25-Hydroxy: 29.7 ng/mL — ABNORMAL LOW (ref 30.0–100.0)

## 2014-12-23 LAB — COPPER, SERUM: Copper: 84 ug/dL (ref 72–166)

## 2014-12-23 LAB — HGB A1C W/O EAG: Hgb A1c MFr Bld: 5.9 % — ABNORMAL HIGH (ref 4.8–5.6)

## 2014-12-23 LAB — B12 AND FOLATE PANEL
Folate: >20 ng/mL (ref >3.0)
Vitamin B-12: 931 pg/mL (ref 211–946)

## 2014-12-23 LAB — ANA W/REFLEX: Anti Nuclear Antibody(ANA): NEGATIVE

## 2014-12-25 ENCOUNTER — Ambulatory Visit (INDEPENDENT_AMBULATORY_CARE_PROVIDER_SITE_OTHER): Payer: BLUE CROSS/BLUE SHIELD | Admitting: Neurology

## 2014-12-25 DIAGNOSIS — R259 Unspecified abnormal involuntary movements: Secondary | ICD-10-CM | POA: Diagnosis not present

## 2014-12-28 ENCOUNTER — Telehealth: Payer: Self-pay | Admitting: Neurology

## 2014-12-28 NOTE — Telephone Encounter (Signed)
I spoke with patient she is aware that the rest of her blood work is normal. She is aware that her EEG has not been read yet and that we will call her as soon as it is.

## 2014-12-28 NOTE — Telephone Encounter (Signed)
Will call her tonight

## 2014-12-28 NOTE — Telephone Encounter (Signed)
Patient called wanting to know the results of her blood work and EEG. Please call and advise. Patient can be reached @ [phone]

## 2014-12-28 NOTE — Telephone Encounter (Signed)
Patient is requesting EEG results.

## 2014-12-29 NOTE — Telephone Encounter (Signed)
EEG normal,  There was beta fast activity, which is usually seen after benzodiazepine intake, but no epileptiform activity.C D

## 2015-01-05 NOTE — Procedures (Signed)
[facility]  EEG (ELECTROENCEPHALOGRAM) REPORT   STUDY DATE:   12-25-14   PATIENT NAME: [name], [name]    MRN: [id number]   ORDERING CLINICIAN:  Huston FoleySaima Athar M.D. For Melvyn Novasarmen Arad Burston, MD   TECHNOLOGIST: Yetta BarreJones TECHNIQUE: Electroencephalogram was recorded utilizing standard 10-20 system of lead placement and reformatted into average and bipolar montages.   RECORDING TIME: 33.4 minutes   ACTIVATION: hyperventilation    CLINICAL INFORMATION:  39 year old Caucasian right-handed female presented to the ER on 12-19-14 is a new onset left hand tremor as well as left shoulder involuntary movement rotational movements. Neurologic consultation in the ER from Dr. Elspeth ChoPeter Sumner D.O. 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 No abnormalities of the cervical spine or laps. Currently treated on clonazepam.     FINDINGS: EEG  Background rhythm of 11 Hz  hertz . This posterior background rhythm is dominant by the patient's eyes are closed and promptly attenuates with eye opening. There is significant be tough fast activity noted over the bifrontal region. There was bitemporal slowing noted with irregular phase reversal activity over T3, T4 P4.  These identify periods of somnolence, but the patient did not progress into sleep.  This does not seem to correlate to the left arm movements as these were not seen during the recording.  The  EKG documented normal sinus rhythm between 65 and 72 bpm   Photic stimulation was deferred due to a technical difficulty , no epileptiform discharges were seen , periodic changes. Patient recorded in the awake/ drowsy,  but not asleep state.  This EEG is considered normal ; the beta fast activity is a medication side effect and explained by the Klonopin intake.   WU:[id number]:However this medication could have suppressed epileptiform activity that may have been present prior  to initiation of the medication.  I would recommend to repeat an EEG off Klonopin if the patient's symptoms do not improve and if no other medical reason can be found.  IMPRESSION:  EEG is normal for patient under the medication effect of a benzodiazepam.        Melvyn Novasarmen Fortune Torosian , MD

## 2015-01-05 NOTE — Progress Notes (Signed)
Quick Note:  Please call and advise the patient that the EEG or brain wave test we performed was reported as normal in the awake state and in keeping with taking clonazepam. We checked for abnormal electrical discharges in the brain waves and the report suggested normal findings. No further action is required on this test at this time. Thanks,  Huston FoleySaima Hasana Alcorta, MD, PhD    ______

## 2015-01-09 ENCOUNTER — Telehealth: Payer: Self-pay

## 2015-01-09 NOTE — Telephone Encounter (Signed)
I spoke to patient. SHe is aware of results and recommendation.

## 2015-01-09 NOTE — Telephone Encounter (Signed)
-----   Message from Huston FoleySaima Athar, MD sent at 01/05/2015  3:03 PM EDT ----- Please call and advise the patient that the EEG or brain wave test we performed was reported as normal in the awake state and in keeping with taking clonazepam. We checked for abnormal electrical discharges in the brain waves and the report suggested normal findings. No further action is required on this test at this time. Thanks,  Huston FoleySaima Athar, MD, PhD

## 2015-03-23 ENCOUNTER — Ambulatory Visit: Payer: Self-pay | Admitting: Neurology

## 2015-07-09 ENCOUNTER — Other Ambulatory Visit: Payer: Self-pay | Admitting: Obstetrics and Gynecology

## 2015-07-09 ENCOUNTER — Other Ambulatory Visit (HOSPITAL_COMMUNITY)
Admission: RE | Admit: 2015-07-09 | Discharge: 2015-07-09 | Disposition: A | Discharge status: Home / Self Care | Payer: BLUE CROSS/BLUE SHIELD | Source: Ambulatory Visit | Attending: Obstetrics and Gynecology | Admitting: Obstetrics and Gynecology

## 2015-07-09 DIAGNOSIS — Z01419 Encounter for gynecological examination (general) (routine) without abnormal findings: Secondary | ICD-10-CM | POA: Insufficient documentation

## 2015-07-11 LAB — CYTOLOGY - PAP

## 2015-12-25 ENCOUNTER — Ambulatory Visit: Payer: BLUE CROSS/BLUE SHIELD | Admitting: Neurology

## 2016-07-10 ENCOUNTER — Other Ambulatory Visit (HOSPITAL_COMMUNITY)
Admission: RE | Admit: 2016-07-10 | Discharge: 2016-07-10 | Disposition: A | Discharge status: Home / Self Care | Payer: Managed Care, Other (non HMO) | Source: Ambulatory Visit | Attending: Obstetrics and Gynecology | Admitting: Obstetrics and Gynecology

## 2016-07-10 ENCOUNTER — Other Ambulatory Visit: Payer: Self-pay | Admitting: Obstetrics and Gynecology

## 2016-07-10 DIAGNOSIS — Z1151 Encounter for screening for human papillomavirus (HPV): Secondary | ICD-10-CM | POA: Diagnosis present

## 2016-07-10 DIAGNOSIS — Z01419 Encounter for gynecological examination (general) (routine) without abnormal findings: Secondary | ICD-10-CM | POA: Diagnosis present

## 2016-07-15 LAB — CYTOLOGY - PAP
DIAGNOSIS: NEGATIVE
HPV (WINDOPATH): NOT DETECTED

## 2017-04-30 ENCOUNTER — Ambulatory Visit: Payer: BLUE CROSS/BLUE SHIELD | Attending: Family Medicine

## 2017-04-30 DIAGNOSIS — M25652 Stiffness of left hip, not elsewhere classified: Secondary | ICD-10-CM | POA: Diagnosis present

## 2017-04-30 DIAGNOSIS — M25561 Pain in right knee: Secondary | ICD-10-CM | POA: Diagnosis present

## 2017-04-30 DIAGNOSIS — M25562 Pain in left knee: Secondary | ICD-10-CM | POA: Insufficient documentation

## 2017-04-30 DIAGNOSIS — M6281 Muscle weakness (generalized): Secondary | ICD-10-CM | POA: Insufficient documentation

## 2017-04-30 DIAGNOSIS — M25651 Stiffness of right hip, not elsewhere classified: Secondary | ICD-10-CM

## 2017-04-30 NOTE — Therapy (Addendum)
[facility] [address], [address] [place], [place], [address] Phone: [phone]   Fax:  [phone]  Physical Therapy Evaluation  Patient Details  Name: [name] MRN: [id number] Date of Birth: Mar 31, 1976 Referring Provider: Eula Listen, MD  Encounter Date: 04/30/2017      PT End of Session - 04/30/17 1234    Visit Number 1   Number of Visits 10   Date for PT Re-Evaluation 06/25/17   PT Start Time 1146   PT Stop Time 1229   PT Time Calculation (min) 43 min   Behavior During Therapy [facility] for tasks assessed/performed      No past medical history on file.  Past Surgical History:  Procedure Laterality Date  . CHOLECYSTECTOMY    . LAPAROSCOPIC CHOLECYSTECTOMY  2010    There were no vitals filed for this visit.       Subjective Assessment - 04/30/17 1149    Subjective Pt presents to PT with bil knee pain that began November 2017.  Pt is active with HIIT training and running and reports that pain began 2 years ago with increase in pain 06/2016.      Pertinent History cortizone injections 3 weeks ago: some improvement   Limitations Sitting   How long can you sit comfortably? knees lock up   Diagnostic tests x-ray and MRI: both negative   Patient Stated Goals reduce knee pain   Currently in Pain? 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  No   Is the patient reluctant to leave their home because of a fear of falling?  No     Home Environment   Living Environment Private residence   Type of Home House   Home Layout Two level     Prior Function   Level of Independence Independent   Vocation Unemployed   Vocation Requirements just lost job   Leisure run, work out, Medical sales representative   Overall Cognitive Status Within Functional Limits for tasks assessed     Observation/Other Assessments   Focus on Therapeutic Outcomes (FOTO)  44% limitation     Posture/Postural Control   Posture/Postural Control No significant limitations     ROM / Strength   AROM / PROM / Strength AROM;PROM;Strength     AROM   Overall AROM  Within functional limits for tasks performed   Overall AROM Comments Bil hip rotation is limited by 25%  PROM   Overall PROM  Deficits   Overall PROM Comments hip IR and ER limited by 25% and hamstring length limited by 25% bil.       Strength   Overall Strength Deficits   Overall Strength Comments Bil hips 4/5, knees 4+/5 quads, 5/5 hamstrings     Palpation   Patella mobility normal mobility with pain reported at fat pad, bil crepitus with patellar mobs and open chain knee flexion   Palpation comment palpable tenderness at bil fat pad. trigger points along lateral quads bil and bil proximal gluteals     Transfers   Transfers Independent with all Transfers     Ambulation/Gait   Ambulation/Gait Yes   Gait Pattern Within Functional Limits   Stairs Yes   Stair Management Technique Alternating pattern   Gait Comments difficulty with eccentric control with descending steps with pain and weakness noted            Objective  measurements completed on examination: See above findings.                  PT Education - 04/30/17 1223    Education provided Yes   Education Details hip flexibility, SLR and clams   Person(s) Educated Patient   Methods Explanation;Demonstration;Handout   Comprehension Verbalized understanding;Returned demonstration          PT Short Term Goals - 04/30/17 1223      PT SHORT TERM GOAL #1   Title be independent in initial HEP   Time 4   Period Weeks   Status New     PT SHORT TERM GOAL #2   Title report a 25% reduction in knee pain and stiffness with riding in the car/driving   Time 4   Period Weeks   Status New     PT SHORT TERM GOAL #3   Title descend steps with step-over-step with good control and 50% less knee pain   Time 4   Period Weeks   Status New     PT SHORT TERM GOAL #4   Title perform [employer] workout with 30% less knee pain   Time 4   Period Weeks   Status New           PT Long Term Goals - 04/30/17 1145      PT LONG TERM GOAL #1   Title be independent in advanced HEP   Time 8   Period Weeks   Status New     PT LONG TERM GOAL #2   Title reduce FOTO to < or = to 33% limitation   Time 8   Period Weeks   Status New     PT LONG TERM GOAL #3   Title return to [employer] without need for modifications due to knee pain   Time 8   Period Weeks   Status New     PT LONG TERM GOAL #4   Title ride in the car/drive with 65% reduction in bil knee pain   Time 8   Period Weeks   Status New     PT LONG TERM GOAL #5   Title descend steps without pain and desmontrate eccentric control with step-over-step gait   Time 8   Period Weeks   Status New     Additional Long Term Goals   Additional Long Term Goals Yes     PT LONG TERM GOAL #6   Title report < or = to 4/10 max knee  pain with activity   Time 8   Period Weeks   Status New                Plan - 04/30/17 1235    Clinical Impression Statement Pt presents to  PT with complaints of bil knee pain that began 06/2016.  Pt is very active with exercise including running and [employer].  Pt had cortizone injections in bil knees and has had some relief of symptoms.  Pt reports up to 9/10 knee pain with sitting long periods, with exercise/activity and descending steps.  Pt demonstrates reduced strength in hips, reduced eccentric strength with descending steps and quad weakness with SLR and LAQs.  Pt with hip stiffness bilaterally.  Pt will benefit from skilled PT for hip flexibility, knee and hip strength and manual to hip and knees to improve mobility.     History and Personal Factors relevant to plan of care: none   Clinical Presentation Evolving   Clinical Presentation due to: increased pain over the past year   Clinical Decision Making Low   Rehab Potential Good   PT Frequency 2x / week   PT Duration 8 weeks   PT Treatment/Interventions ADLs/Self Care Home Management;Cryotherapy;Electrical Stimulation;Functional mobility training;Stair training;Gait training;Moist Heat;Therapeutic activities;Therapeutic exercise;Neuromuscular re-education;Patient/family education;Passive range of motion;Manual techniques;Dry needling;Taping   PT Next Visit Plan hip and quad strength, consider dry needling to gluteals and lateral quads, hip flexibility   Consulted and Agree with Plan of Care Patient      Patient will benefit from skilled therapeutic intervention in order to improve the following deficits and impairments:  Pain, Decreased strength, Impaired flexibility, Decreased activity tolerance, Decreased endurance, Increased muscle spasms, Decreased range of motion  Visit Diagnosis: Acute pain of left knee - Plan: PT plan of care cert/re-cert  Acute pain of right knee - Plan: PT plan of care cert/re-cert  Muscle weakness (generalized) - Plan: PT plan of care cert/re-cert  Stiffness of left hip, not elsewhere classified - Plan: PT plan of care  cert/re-cert  Stiffness of right hip, not elsewhere classified - Plan: PT plan of care cert/re-cert     Problem List Patient Active Problem List   Diagnosis Date Noted  . Abnormal involuntary movements 12/19/2014     Lorrene Reid, PT 04/30/17 12:45 PM  [facility] [address], [address] [place], [place], [address] Phone: [phone]   Fax:  [phone]  Name: [name] MRN: [id number] Date of Birth: January 29, 1976

## 2017-04-30 NOTE — Patient Instructions (Addendum)
Knee to Chest    Lying supine, bend involved knee to chest.  Hold 20 seconds.  Do 3 ___ times. Repeat with other leg. Do _3__ times per day.  Copyright  VHI. All rights reserved.  Straight Leg Raise    Tighten top of left thigh. Raise leg to the height of the opposite knee.  Perform 2x10.  2x/day.     Abduction: Clam (Eccentric) - Side-Lying    Lie on side with knees bent. Lift top knee, keeping feet together. Keep trunk steady.  Perform 2x10, 2x/day http://ecce.exer.us/65   Copyright  VHI. All rights reserved.   HIP: Hamstrings - Short Sitting    Rest leg on raised surface. Keep knee straight. Lift chest. Hold _20__ seconds. _3__ reps per set, _3__ sets per day, ___ days per week     Hip Stretch  Put right ankle over left knee. Let right knee fall downward, but keep ankle in place. Feel the stretch in hip. May push down gently with hand to feel stretch. Hold __20__ seconds while counting out loud. Repeat with other leg. Repeat __3__ times. Do _3___ sessions per day.   Stretching: Piriformis (Supine)  Pull right knee toward opposite shoulder. Hold _20___ seconds. Relax. Repeat __3__ times per set. Do _3___ sets per session. Do ____ sessions per day.  [facility] [address] [place], [place] [address] Phone # [phone] Fax [phone]

## 2017-04-30 NOTE — Addendum Note (Signed)
Addended by: Edrick Oh on: 04/30/2017 12:47 PM   Modules accepted: Orders

## 2017-05-04 ENCOUNTER — Ambulatory Visit: Payer: BLUE CROSS/BLUE SHIELD | Admitting: Physical Therapy

## 2017-05-04 DIAGNOSIS — M25561 Pain in right knee: Secondary | ICD-10-CM

## 2017-05-04 DIAGNOSIS — M6281 Muscle weakness (generalized): Secondary | ICD-10-CM

## 2017-05-04 DIAGNOSIS — M25562 Pain in left knee: Secondary | ICD-10-CM

## 2017-05-04 DIAGNOSIS — M25651 Stiffness of right hip, not elsewhere classified: Secondary | ICD-10-CM

## 2017-05-04 DIAGNOSIS — M25652 Stiffness of left hip, not elsewhere classified: Secondary | ICD-10-CM

## 2017-05-04 NOTE — Patient Instructions (Signed)
2x20 sec   x20 reps, 3 sec hold

## 2017-05-04 NOTE — Therapy (Signed)
[facility] [address] [place], [place], [address] Phone: [phone]   Fax:  [phone]  Physical Therapy Treatment  Patient Details  Name: [name] MRN: [id number] Date of Birth: [birth year] Referring Provider: Eula Listen, MD  Encounter Date: 05/04/2017      PT End of Session - 05/04/17 1346    Visit Number 2   Number of Visits 10   Date for PT Re-Evaluation 06/25/17   PT Start Time 1232   PT Stop Time 1314   PT Time Calculation (min) 42 min   Activity Tolerance Patient limited by pain;Patient tolerated treatment well   Behavior During Therapy [facility] for tasks assessed/performed      No past medical history on file.  Past Surgical History:  Procedure Laterality Date  . CHOLECYSTECTOMY    . LAPAROSCOPIC CHOLECYSTECTOMY  2010    There were no vitals filed for this visit.      Subjective Assessment - 05/04/17 1237    Subjective Pt reports that she has more issues with her Rt groin area lately, which has distracted her from her knee pain. This has been going on since Monday or Tuesday.    Pertinent History cortizone injections 3 weeks ago: some improvement   Limitations Sitting   How long can you sit comfortably? knees lock up   Diagnostic tests x-ray and MRI: both negative   Patient Stated Goals reduce knee pain   Currently in Pain? Yes   Pain Score 5    Pain Location Groin   Pain Orientation Right   Pain Descriptors / Indicators Aching;Dull   Pain Type Acute pain   Pain Radiating Towards none    Pain Onset More than a month ago   Pain Frequency Intermittent   Aggravating Factors  clamshells, going up stairs    Pain Relieving Factors ice, gentle stretching                          [facility] Adult PT Treatment/Exercise - 05/04/17 0001      Exercises   Exercises Knee/Hip     Knee/Hip Exercises: Stretches   Hip Flexor Stretch 5 reps;20 seconds;Both   Hip Flexor  Stretch Limitations half kneel position on pillow    Other Knee/Hip Stretches butterfly stretch (gentle) 2x20 sec      Knee/Hip Exercises: Seated   Ball Squeeze x20 reps, 3 sec hold      Knee/Hip Exercises: Supine   Bridges Both;1 set;15 reps   Bridges Limitations green TB around knees    Other Supine Knee/Hip Exercises bridge hold with green TB around knees and alt knee ext hold 2x5 reps each    Other Supine Knee/Hip Exercises supine hip flexion/extension x10 reps, each with therapist assistance on the Rt in thomas test position.                PT Education - 05/04/17 1345    Education provided Yes   Education Details discussed benefits of gentle stretching and strengthening and ice in improving acute muscle strains; additions to HEP    Person(s) Educated Patient   Methods Verbal cues;Handout;Explanation   Comprehension Returned demonstration;Verbalized understanding          PT Short Term Goals - 04/30/17 1223      PT SHORT TERM GOAL #1   Title be independent in initial HEP   Time 4   Period Weeks   Status New  PT SHORT TERM GOAL #2   Title report a 25% reduction in knee pain and stiffness with riding in the car/driving   Time 4   Period Weeks   Status New     PT SHORT TERM GOAL #3   Title descend steps with step-over-step with good control and 50% less knee pain   Time 4   Period Weeks   Status New     PT SHORT TERM GOAL #4   Title perform [employer] workout with 30% less knee pain   Time 4   Period Weeks   Status New           PT Long Term Goals - 04/30/17 1145      PT LONG TERM GOAL #1   Title be independent in advanced HEP   Time 8   Period Weeks   Status New     PT LONG TERM GOAL #2   Title reduce FOTO to < or = to 33% limitation   Time 8   Period Weeks   Status New     PT LONG TERM GOAL #3   Title return to [employer] without need for modifications due to knee pain   Time 8   Period Weeks   Status New     PT LONG  TERM GOAL #4   Title ride in the car/drive with 16% reduction in bil knee pain   Time 8   Period Weeks   Status New     PT LONG TERM GOAL #5   Title descend steps without pain and desmontrate eccentric control with step-over-step gait   Time 8   Period Weeks   Status New     Additional Long Term Goals   Additional Long Term Goals Yes     PT LONG TERM GOAL #6   Title report < or = to 4/10 max knee pain with activity   Time 8   Period Weeks   Status New               Plan - 05/04/17 1346    Clinical Impression Statement Pt arrived today reporting primary issues with her Rt groin region which are also aggravated during HEP adherence. Therapist reviewed several of these and made adjustments to improve pt's adherence. Completed gentle hip stretching and strengthening this session, within pt's pain tolerance, and ended with a couple of added stretches to her HEP. Pt reported intermittent Rt groin pain increase throughout the session, however this did not increase her pain rating by the end of the session.    Rehab Potential Good   PT Frequency 2x / week   PT Duration 8 weeks   PT Treatment/Interventions ADLs/Self Care Home Management;Cryotherapy;Electrical Stimulation;Functional mobility training;Stair training;Gait training;Moist Heat;Therapeutic activities;Therapeutic exercise;Neuromuscular re-education;Patient/family education;Passive range of motion;Manual techniques;Dry needling;Taping   PT Next Visit Plan hip and quad strength, consider dry needling to hip adductors, gluteals and lateral quads, hip flexibility   PT Home Exercise Plan added butterfly stretch and ball squeeze    Consulted and Agree with Plan of Care Patient      Patient will benefit from skilled therapeutic intervention in order to improve the following deficits and impairments:  Pain, Decreased strength, Impaired flexibility, Decreased activity tolerance, Decreased endurance, Increased muscle spasms,  Decreased range of motion  Visit Diagnosis: Acute pain of left knee  Acute pain of right knee  Muscle weakness (generalized)  Stiffness of left hip, not elsewhere classified  Stiffness of right hip,  not elsewhere classified     Problem List Patient Active Problem List   Diagnosis Date Noted  . Abnormal involuntary movements 12/19/2014   1:52 PM,05/04/17 Marylyn Ishihara PT, DPT [facility]  [phone]  [facility] [address], [address] [place], [place], [address] Phone: [phone]   Fax:  [phone]  Name: RAYLEA ADCOX MRN: [id number] Date of Birth: 26-Jul-1976

## 2017-05-06 ENCOUNTER — Ambulatory Visit: Payer: BLUE CROSS/BLUE SHIELD | Admitting: Physical Therapy

## 2017-05-06 DIAGNOSIS — M25561 Pain in right knee: Secondary | ICD-10-CM

## 2017-05-06 DIAGNOSIS — M25562 Pain in left knee: Secondary | ICD-10-CM | POA: Diagnosis not present

## 2017-05-06 DIAGNOSIS — M25652 Stiffness of left hip, not elsewhere classified: Secondary | ICD-10-CM

## 2017-05-06 DIAGNOSIS — M6281 Muscle weakness (generalized): Secondary | ICD-10-CM

## 2017-05-06 DIAGNOSIS — M25651 Stiffness of right hip, not elsewhere classified: Secondary | ICD-10-CM

## 2017-05-06 NOTE — Therapy (Signed)
[facility] [address] [place], [place], [address] Phone: [phone]   Fax:  [phone]  Physical Therapy Treatment  Patient Details  Name: [name] MRN: [id number] Date of Birth: 05/23/1976 Referring Provider: Eula Listen, MD  Encounter Date: 05/06/2017      PT End of Session - 05/06/17 0818    Visit Number 3   Number of Visits 10   Date for PT Re-Evaluation 06/25/17   PT Start Time 0804   PT Stop Time 0844   PT Time Calculation (min) 40 min   Activity Tolerance Patient limited by pain;Patient tolerated treatment well;No increased pain   Behavior During Therapy [facility] for tasks assessed/performed      No past medical history on file.  Past Surgical History:  Procedure Laterality Date  . CHOLECYSTECTOMY    . LAPAROSCOPIC CHOLECYSTECTOMY  2010    There were no vitals filed for this visit.      Subjective Assessment - 05/06/17 0808    Subjective Pt reports that she was very sore following her last session, but since then things have been improving.    Pertinent History cortizone injections 3 weeks ago: some improvement   Limitations Sitting   How long can you sit comfortably? knees lock up   Diagnostic tests x-ray and MRI: both negative   Patient Stated Goals reduce knee pain   Currently in Pain? Yes   Pain Score 2    Pain Location Groin   Pain Orientation Right   Pain Descriptors / Indicators Aching;Dull   Pain Type Acute pain   Pain Onset More than a month ago   Pain Frequency Intermittent   Aggravating Factors  going up stairs, clamshells    Pain Relieving Factors ice, stretching                          [facility] Adult PT Treatment/Exercise - 05/06/17 0001      Knee/Hip Exercises: Stretches   Quad Stretch 5 reps;10 seconds;Both   Quad Stretch Limitations standing with mini squat hold      Knee/Hip Exercises: Standing   Other Standing Knee Exercises hip  hike/lower 2x10 reps each, 1 UE support      Knee/Hip Exercises: Seated   Long Arc Quad Both;1 set;20 reps   Long Arc Quad Weight 2 lbs.   Long Arc Quad Limitations slight hip ER   Abduction/Adduction  Both;1 set;15 reps   Abd/Adduction Limitations long sitting, within pain free range     Knee/Hip Exercises: Supine   Short Arc Quad Sets Both;1 set;20 reps   Short Arc Quad Sets Limitations 2# ankle weights, slight ER     Manual Therapy   Manual Therapy Soft tissue mobilization   Soft tissue mobilization Rt hip adductors                 PT Education - 05/06/17 [phone]    Education provided Yes   Education Details difference between hip strength and stability, importance of addressing mechanics with exercise to decrease strain on the knee   Person(s) Educated Patient   Methods Explanation   Comprehension Verbalized understanding          PT Short Term Goals - 04/30/17 1223      PT SHORT TERM GOAL #1   Title be independent in initial HEP   Time 4   Period Weeks   Status New     PT SHORT TERM  GOAL #2   Title report a 25% reduction in knee pain and stiffness with riding in the car/driving   Time 4   Period Weeks   Status New     PT SHORT TERM GOAL #3   Title descend steps with step-over-step with good control and 50% less knee pain   Time 4   Period Weeks   Status New     PT SHORT TERM GOAL #4   Title perform [employer] workout with 30% less knee pain   Time 4   Period Weeks   Status New           PT Long Term Goals - 04/30/17 1145      PT LONG TERM GOAL #1   Title be independent in advanced HEP   Time 8   Period Weeks   Status New     PT LONG TERM GOAL #2   Title reduce FOTO to < or = to 33% limitation   Time 8   Period Weeks   Status New     PT LONG TERM GOAL #3   Title return to [employer] without need for modifications due to knee pain   Time 8   Period Weeks   Status New     PT LONG TERM GOAL #4   Title ride in the car/drive  with 95% reduction in bil knee pain   Time 8   Period Weeks   Status New     PT LONG TERM GOAL #5   Title descend steps without pain and desmontrate eccentric control with step-over-step gait   Time 8   Period Weeks   Status New     Additional Long Term Goals   Additional Long Term Goals Yes     PT LONG TERM GOAL #6   Title report < or = to 4/10 max knee pain with activity   Time 8   Period Weeks   Status New               Plan - 05/06/17 6387    Clinical Impression Statement Pt is making progress towards improving groin pain, reporting no more than 2/10 pain upon arrival with adherence to HEP updates made last session. Session focused on improving quadriceps flexibility and gluteal strength and control with verbal cues needed intermittently for proper technique. Pt demonstrates increased difficulty with RLE stability during single leg mini squat evident by muscle shaking and significant knee valgus deviation. Ended with gentle soft tissue mobilization to address hip adductor muscle spasm. Pt reporting no increase in groin pain by the end of today's session.    Rehab Potential Good   PT Frequency 2x / week   PT Duration 8 weeks   PT Treatment/Interventions ADLs/Self Care Home Management;Cryotherapy;Electrical Stimulation;Functional mobility training;Stair training;Gait training;Moist Heat;Therapeutic activities;Therapeutic exercise;Neuromuscular re-education;Patient/family education;Passive range of motion;Manual techniques;Dry needling;Taping   PT Next Visit Plan hip and quad strength, consider dry needling to hip adductors, gluteals and lateral quads; hip flexibility   PT Home Exercise Plan added butterfly stretch and ball squeeze    Consulted and Agree with Plan of Care Patient      Patient will benefit from skilled therapeutic intervention in order to improve the following deficits and impairments:  Pain, Decreased strength, Impaired flexibility, Decreased activity  tolerance, Decreased endurance, Increased muscle spasms, Decreased range of motion  Visit Diagnosis: Acute pain of left knee  Acute pain of right knee  Stiffness of left hip, not elsewhere classified  Muscle weakness (generalized)  Stiffness of right hip, not elsewhere classified     Problem List Patient Active Problem List   Diagnosis Date Noted  . 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 [place], [place], [address] Phone: [phone]   Fax:  [phone]  Name: [name] MRN: [id number] Date of Birth: 1976-02-22

## 2017-05-11 ENCOUNTER — Ambulatory Visit: Payer: BLUE CROSS/BLUE SHIELD | Attending: Family Medicine

## 2017-05-11 DIAGNOSIS — M25562 Pain in left knee: Secondary | ICD-10-CM | POA: Insufficient documentation

## 2017-05-11 DIAGNOSIS — M25651 Stiffness of right hip, not elsewhere classified: Secondary | ICD-10-CM | POA: Diagnosis not present

## 2017-05-11 DIAGNOSIS — M25561 Pain in right knee: Secondary | ICD-10-CM | POA: Diagnosis not present

## 2017-05-11 DIAGNOSIS — M25652 Stiffness of left hip, not elsewhere classified: Secondary | ICD-10-CM | POA: Diagnosis not present

## 2017-05-11 DIAGNOSIS — M6281 Muscle weakness (generalized): Secondary | ICD-10-CM | POA: Insufficient documentation

## 2017-05-11 NOTE — Therapy (Signed)
[facility] [address] [place], [place], [address] Phone: [phone]   Fax:  [phone]  Physical Therapy Treatment  Patient Details  Name: [name] MRN: [id number] Date of Birth: [birth year] Referring Provider: Eula Listen, MD  Encounter Date: 05/11/2017      PT End of Session - 05/11/17 1144    Visit Number 4   Date for PT Re-Evaluation 06/25/17   PT Start Time 1100   PT Stop Time 1146   PT Time Calculation (min) 46 min   Activity Tolerance Patient tolerated treatment well   Behavior During Therapy [facility] for tasks assessed/performed      History reviewed. No pertinent past medical history.  Past Surgical History:  Procedure Laterality Date  . CHOLECYSTECTOMY    . LAPAROSCOPIC CHOLECYSTECTOMY  2010    There were no vitals filed for this visit.      Subjective Assessment - 05/11/17 1105    Subjective Pt has been doing therapy exercises.  I havent been doing my regular exercise class but will return to this tomorrow.     Currently in Pain? Yes   Pain Score 2    Pain Location Knee   Pain Orientation Right;Left   Pain Descriptors / Indicators Aching;Dull   Pain Type Acute pain   Pain Onset More than a month ago   Pain Frequency Intermittent   Aggravating Factors  going up/down stairs   Pain Relieving Factors ice, stretching                         [facility] Adult PT Treatment/Exercise - 05/11/17 0001      Knee/Hip Exercises: Stretches   Other Knee/Hip Stretches butterfly stretch (gentle) 2x20 sec      Knee/Hip Exercises: Aerobic   Elliptical Level 4, ramp 5 x 7 minutes     Knee/Hip Exercises: Standing   Other Standing Knee Exercises single leg stance with ball toss 2x10 Rt and Lt     Knee/Hip Exercises: Seated   Long Arc Quad Both;1 set;20 reps   Long Arc Quad Weight 2 lbs.   Long [employer] Limitations slight hip ER   Sit to [employer] 2 sets;10 reps  standing on black  balance pod, verbal cues to not lock knees     Knee/Hip Exercises: Supine   Short Arc Quad Sets Both;1 set;20 reps   Short Arc Quad Sets Limitations 2# with ER      Manual Therapy   Manual Therapy Soft tissue mobilization   Soft tissue mobilization Rt hip adductors and quads with orange roller                  PT Short Term Goals - 05/11/17 1110      PT SHORT TERM GOAL #1   Title be independent in initial HEP   Status Achieved     PT SHORT TERM GOAL #2   Title report a 25% reduction in knee pain and stiffness with riding in the car/driving   Baseline havent done any long trips   Time 4   Period Weeks   Status On-going     PT SHORT TERM GOAL #3   Title descend steps with step-over-step with good control and 50% less knee pain   Baseline 60% improvement   Status Achieved     PT SHORT TERM GOAL #4   Title perform [employer] workout with 30% less knee pain   Baseline hasn't  started back   Time 4   Period Weeks   Status On-going           PT Long Term Goals - 04/30/17 1145      PT LONG TERM GOAL #1   Title be independent in advanced HEP   Time 8   Period Weeks   Status New     PT LONG TERM GOAL #2   Title reduce FOTO to < or = to 33% limitation   Time 8   Period Weeks   Status New     PT LONG TERM GOAL #3   Title return to [employer] workout without need for modifications due to knee pain   Time 8   Period Weeks   Status New     PT LONG TERM GOAL #4   Title ride in the car/drive with 78% reduction in bil knee pain   Time 8   Period Weeks   Status New     PT LONG TERM GOAL #5   Title descend steps without pain and desmontrate eccentric control with step-over-step gait   Time 8   Period Weeks   Status New     Additional Long Term Goals   Additional Long Term Goals Yes     PT LONG TERM GOAL #6   Title report < or = to 4/10 max knee pain with activity   Time 8   Period Weeks   Status New               Plan - 05/11/17 1112     Clinical Impression Statement Pt with improved groin pain and reduced bil knee pain and stiffness.  Pt with reduced quad control with sit to stand and was able to correct with verbal cues.  Pt hasn't driven long distances to see if she has knee pain.  Pt with reduced pain with descending steps.  Pt will continue to benefit from skilled PT for LE strength, flexibility and maual therapy as needed.   Rehab Potential Good   PT Frequency 2x / week   PT Duration 8 weeks   PT Treatment/Interventions ADLs/Self Care Home Management;Cryotherapy;Electrical Stimulation;Functional mobility training;Stair training;Gait training;Moist Heat;Therapeutic activities;Therapeutic exercise;Neuromuscular re-education;Patient/family education;Passive range of motion;Manual techniques;Dry needling;Taping   PT Next Visit Plan hip and quad strength, consider dry needling to hip adductors, gluteals and lateral quads; hip flexibility   Recommended Other Services initial certification is signed   Consulted and Agree with Plan of Care Patient      Patient will benefit from skilled therapeutic intervention in order to improve the following deficits and impairments:  Pain, Decreased strength, Impaired flexibility, Decreased activity tolerance, Decreased endurance, Increased muscle spasms, Decreased range of motion  Visit Diagnosis: Acute pain of left knee  Acute pain of right knee  Stiffness of left hip, not elsewhere classified  Stiffness of right hip, not elsewhere classified  Muscle weakness (generalized)     Problem List Patient Active Problem List   Diagnosis Date Noted  . 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 [place], [place], [address] Phone: [phone]   Fax:  [phone]  Name: CECILA SATCHER MRN: [id number] Date of Birth: 10-14-1975

## 2017-05-13 ENCOUNTER — Ambulatory Visit: Payer: BLUE CROSS/BLUE SHIELD | Admitting: Physical Therapy

## 2017-05-13 DIAGNOSIS — M25562 Pain in left knee: Secondary | ICD-10-CM

## 2017-05-13 DIAGNOSIS — M25561 Pain in right knee: Secondary | ICD-10-CM

## 2017-05-13 DIAGNOSIS — M25651 Stiffness of right hip, not elsewhere classified: Secondary | ICD-10-CM | POA: Diagnosis not present

## 2017-05-13 DIAGNOSIS — M25652 Stiffness of left hip, not elsewhere classified: Secondary | ICD-10-CM | POA: Diagnosis not present

## 2017-05-13 DIAGNOSIS — M6281 Muscle weakness (generalized): Secondary | ICD-10-CM

## 2017-05-13 NOTE — Therapy (Signed)
[facility] [address], [address] [place], [place], [address] Phone: [phone]   Fax:  [phone]  Physical Therapy Treatment  Patient Details  Name: [name] MRN: [id number] Date of Birth: 10-Jun-1976 Referring Provider: Eula Listen, MD  Encounter Date: 05/13/2017      PT End of Session - 05/13/17 0834    Visit Number 5   Date for PT Re-Evaluation 06/25/17   PT Start Time 0803   PT Stop Time 0844   PT Time Calculation (min) 41 min   Activity Tolerance Patient tolerated treatment well;No increased pain   Behavior During Therapy [facility] for tasks assessed/performed      No past medical history on file.  Past Surgical History:  Procedure Laterality Date  . CHOLECYSTECTOMY    . LAPAROSCOPIC CHOLECYSTECTOMY  2010    There were no vitals filed for this visit.      Subjective Assessment - 05/13/17 0807    Subjective Pt reports things are improving. Her groin did not bother her at the gym the other day. Now her knees are the primary issue.   Currently in Pain? Yes   Pain Score 3   Lt knee; Rt knee 1/10   Pain Descriptors / Indicators Aching;Dull   Pain Type Acute pain   Pain Radiating Towards none    Pain Onset More than a month ago   Pain Frequency Intermittent   Aggravating Factors  sit to stand and going up/down stairs   Pain Relieving Factors ice, stretching                          [facility] Adult PT Treatment/Exercise - 05/13/17 0001      Knee/Hip Exercises: Aerobic   Elliptical Level 4, ramp 3 (2' forward/2' backward)      Knee/Hip Exercises: Standing   Knee Flexion Both;1 set;15 reps   Knee Flexion Limitations facing countertop to decrease trunk lean and hip flexion compensation   Step Down 1 set;Both;10 reps   Other Standing Knee Exercises attempting single leg eccentric sit on mat table but unable without increase in pain.      Knee/Hip Exercises: Seated   Long Arc Quad  Both;2 sets;15 reps   Long Arc Quad Weight 3 lbs.   Long Arc Quad Limitations slight hip ER   Sit to [employer] 2 sets;without UE support;Other (comment)  red TB around knees to cue for      Knee/Hip Exercises: Supine   Single Leg Bridge Both;1 set;10 reps  leg lock bridge    Straight Leg Raise with External Rotation Both;2 sets;10 reps   Straight Leg Raise with External Rotation Limitations BUE pressdown                 PT Education - 05/13/17 0834    Education provided Yes   Education Details technique with therex   Person(s) Educated Patient   Methods Explanation;Verbal cues;Other (comment)  mirror feedback    Comprehension Verbalized understanding;Returned demonstration          PT Short Term Goals - 05/11/17 1110      PT SHORT TERM GOAL #1   Title be independent in initial HEP   Status Achieved     PT SHORT TERM GOAL #2   Title report a 25% reduction in knee pain and stiffness with riding in the car/driving   Baseline havent done any long trips   Time 4   Period Weeks  Status On-going     PT SHORT TERM GOAL #3   Title descend steps with step-over-step with good control and 50% less knee pain   Baseline 60% improvement   Status Achieved     PT SHORT TERM GOAL #4   Title perform [employer] workout with 30% less knee pain   Baseline hasn't started back   Time 4   Period Weeks   Status On-going           PT Long Term Goals - 04/30/17 1145      PT LONG TERM GOAL #1   Title be independent in advanced HEP   Time 8   Period Weeks   Status New     PT LONG TERM GOAL #2   Title reduce FOTO to < or = to 33% limitation   Time 8   Period Weeks   Status New     PT LONG TERM GOAL #3   Title return to [employer] workout without need for modifications due to knee pain   Time 8   Period Weeks   Status New     PT LONG TERM GOAL #4   Title ride in the car/drive with 16% reduction in bil knee pain   Time 8   Period Weeks   Status New     PT LONG TERM  GOAL #5   Title descend steps without pain and desmontrate eccentric control with step-over-step gait   Time 8   Period Weeks   Status New     Additional Long Term Goals   Additional Long Term Goals Yes     PT LONG TERM GOAL #6   Title report < or = to 4/10 max knee pain with activity   Time 8   Period Weeks   Status New               Plan - 05/13/17 0846    Clinical Impression Statement Pt continues to report improvements in groin pain evident by her ability to complete her workout over the weekend without increase in her pain. Continued this session with therex to promote posterior chain strength and medial quadriceps activation during activity. Pt able to complete increased reps and resistance with several exercises, reporting no increase in pain by the end of the session. Encouraged her to continue with therex addressing the groin to allow for more progressions of exercises during her sessions. Pt will continue to benefit from skilled PT to address her pain and other limitations and facilitate return to regular daily activity.    Rehab Potential Good   PT Frequency 2x / week   PT Duration 8 weeks   PT Treatment/Interventions ADLs/Self Care Home Management;Cryotherapy;Electrical Stimulation;Functional mobility training;Stair training;Gait training;Moist Heat;Therapeutic activities;Therapeutic exercise;Neuromuscular re-education;Patient/family education;Passive range of motion;Manual techniques;Dry needling;Taping   PT Next Visit Plan hip and quad strength, consider dry needling to hip adductors, gluteals and lateral quads; hip flexibility   PT Home Exercise Plan added butterfly stretch and ball squeeze    Consulted and Agree with Plan of Care Patient      Patient will benefit from skilled therapeutic intervention in order to improve the following deficits and impairments:  Pain, Decreased strength, Impaired flexibility, Decreased activity tolerance, Decreased endurance,  Increased muscle spasms, Decreased range of motion  Visit Diagnosis: Acute pain of left knee  Acute pain of right knee  Stiffness of left hip, not elsewhere classified  Stiffness of right hip, not elsewhere classified  Muscle weakness (generalized)  Problem List Patient Active Problem List   Diagnosis Date Noted  . 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 [place], [place], [address] Phone: [phone]   Fax:  [phone]  Name: [name] MRN: [id number] Date of Birth: [birth year]

## 2017-05-19 ENCOUNTER — Ambulatory Visit: Payer: BLUE CROSS/BLUE SHIELD

## 2017-05-19 DIAGNOSIS — M6281 Muscle weakness (generalized): Secondary | ICD-10-CM

## 2017-05-19 DIAGNOSIS — M25651 Stiffness of right hip, not elsewhere classified: Secondary | ICD-10-CM | POA: Diagnosis not present

## 2017-05-19 DIAGNOSIS — F4323 Adjustment disorder with mixed anxiety and depressed mood: Secondary | ICD-10-CM | POA: Diagnosis not present

## 2017-05-19 DIAGNOSIS — M25561 Pain in right knee: Secondary | ICD-10-CM

## 2017-05-19 DIAGNOSIS — M25562 Pain in left knee: Secondary | ICD-10-CM

## 2017-05-19 DIAGNOSIS — M25652 Stiffness of left hip, not elsewhere classified: Secondary | ICD-10-CM

## 2017-05-19 NOTE — Therapy (Signed)
[facility] [address] [place], [place], [address] Phone: [phone]   Fax:  [phone]  Physical Therapy Treatment  Patient Details  Name: [name] MRN: [id number] Date of Birth: [birth year] Referring Provider: Eula Listen, MD  Encounter Date: 05/19/2017      PT End of Session - 05/19/17 0843    Visit Number 6   Number of Visits 10   Date for PT Re-Evaluation 06/25/17   PT Start Time 0803   PT Stop Time 0845   PT Time Calculation (min) 42 min   Activity Tolerance Patient tolerated treatment well   Behavior During Therapy [facility] for tasks assessed/performed      No past medical history on file.  Past Surgical History:  Procedure Laterality Date  . CHOLECYSTECTOMY    . LAPAROSCOPIC CHOLECYSTECTOMY  2010    There were no vitals filed for this visit.      Subjective Assessment - 05/19/17 0803    Subjective I had an intense exercise class yesterday so I feel a little rough today.  I have been icing.     Pertinent History cortizone injections 3 weeks ago: some improvement   Currently in Pain? Yes   Pain Score 4   4/10 Lt, 2/10 Rt   Pain Location Knee   Pain Orientation Left;Right   Pain Descriptors / Indicators Aching;Dull   Pain Type Acute pain   Pain Onset More than a month ago   Pain Frequency Intermittent   Aggravating Factors  sit to stand and going up/down steps   Pain Relieving Factors ice, stretching                         [facility] Adult PT Treatment/Exercise - 05/19/17 0001      Knee/Hip Exercises: Stretches   Lobbyist 2 reps;30 seconds   Other Knee/Hip Stretches butterfly stretch (gentle) 2x20 sec      Knee/Hip Exercises: Aerobic   Elliptical Level 4, ramp 3 x 6 minutes     Knee/Hip Exercises: Machines for Strengthening   [employer]bex Leg Press 75# bil with ball squeeze 2x10, single leg 35# 2x10  seat 5     Knee/Hip Exercises: Standing   Step Down Both;10  reps;2 sets;Step Height: 4"     Knee/Hip Exercises: Seated   Long Arc Quad Both;2 sets;10 reps   Long Arc Quad Limitations slight hip ER     Manual Therapy   Manual Therapy Soft tissue mobilization   Manual therapy comments orange roller over Rt and Lt hip flexors                  PT Short Term Goals - 05/19/17 0808      PT SHORT TERM GOAL #2   Title report a 25% reduction in knee pain and stiffness with riding in the car/driving   Baseline 69%    Status Achieved     PT SHORT TERM GOAL #3   Title descend steps with step-over-step with good control and 50% less knee pain   Status Achieved     PT SHORT TERM GOAL #4   Title perform [employer] workout with 30% less knee pain   Baseline intense workout with increased pain   Time 4   Period Weeks   Status On-going           PT Long Term Goals - 05/19/17 0809      PT LONG TERM GOAL #  1   Title be independent in advanced HEP   Time 8   Period Weeks   Status On-going     PT LONG TERM GOAL #3   Title return to [employer] workout without need for modifications due to knee pain   Time 8   Period Weeks   Status On-going     PT LONG TERM GOAL #4   Title ride in the car/drive with 40% reduction in bil knee pain   Baseline 40% reduction   Time 8   Period Weeks   Status On-going     PT LONG TERM GOAL #6   Title report < or = to 4/10 max knee pain with activity   Baseline up to 7/10 with exercise, 4/10 with regular activity   Time 8   Period Weeks   Status On-going               Plan - 05/19/17 0811    Clinical Impression Statement Pt is able to increase activity with [facility] workout.  Pt reports increased pain after workout up to 7/10.  Pain with regular activity is 4/10 max.  Pt with incresaed pain with eccentric activity especialy on the Lt.  Pt reports 50% overall pain reduction and 40% reduction in stiffness with sitting long periods in the car.  Pt continues to stretch and work on eccentric  strength.  Pt will continue to benefit from skilled PT for LE strength, endurance, flexibility and pain management.     Rehab Potential Good   PT Frequency 2x / week   PT Duration 8 weeks   PT Treatment/Interventions ADLs/Self Care Home Management;Cryotherapy;Electrical Stimulation;Functional mobility training;Stair training;Gait training;Moist Heat;Therapeutic activities;Therapeutic exercise;Neuromuscular re-education;Patient/family education;Passive range of motion;Manual techniques;Dry needling;Taping   PT Next Visit Plan hip and quad strength,  gluteals and lateral quads; hip flexibility   Consulted and Agree with Plan of Care Patient      Patient will benefit from skilled therapeutic intervention in order to improve the following deficits and impairments:  Pain, Decreased strength, Impaired flexibility, Decreased activity tolerance, Decreased endurance, Increased muscle spasms, Decreased range of motion  Visit Diagnosis: Acute pain of left knee  Acute pain of right knee  Stiffness of left hip, not elsewhere classified  Stiffness of right hip, not elsewhere classified  Muscle weakness (generalized)     Problem List Patient Active Problem List   Diagnosis Date Noted  . 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 [place], [place], [address] Phone: [phone]   Fax:  [phone]  Name: [name] MRN: [id number] Date of Birth: 1976/02/17

## 2017-05-21 ENCOUNTER — Ambulatory Visit: Payer: BLUE CROSS/BLUE SHIELD | Admitting: Physical Therapy

## 2017-05-21 DIAGNOSIS — M6281 Muscle weakness (generalized): Secondary | ICD-10-CM

## 2017-05-21 DIAGNOSIS — M25562 Pain in left knee: Secondary | ICD-10-CM

## 2017-05-21 DIAGNOSIS — M25652 Stiffness of left hip, not elsewhere classified: Secondary | ICD-10-CM | POA: Diagnosis not present

## 2017-05-21 DIAGNOSIS — M25561 Pain in right knee: Secondary | ICD-10-CM

## 2017-05-21 DIAGNOSIS — M25651 Stiffness of right hip, not elsewhere classified: Secondary | ICD-10-CM

## 2017-05-21 NOTE — Therapy (Signed)
[facility] [address], [address] [place], [place], [address] Phone: [phone]   Fax:  [phone]  Physical Therapy Treatment  Patient Details  Name: [name] MRN: [id number] Date of Birth: [birth year] Referring Provider: Eula Listen, MD  Encounter Date: 05/21/2017      PT End of Session - 05/21/17 0902    Visit Number 7   Number of Visits 10   Date for PT Re-Evaluation 06/25/17   PT Start Time 0802   PT Stop Time 0844   PT Time Calculation (min) 42 min   Activity Tolerance Patient tolerated treatment well;No increased pain   Behavior During Therapy [facility] for tasks assessed/performed      No past medical history on file.  Past Surgical History:  Procedure Laterality Date  . CHOLECYSTECTOMY    . LAPAROSCOPIC CHOLECYSTECTOMY  2010    There were no vitals filed for this visit.      Subjective Assessment - 05/21/17 0805    Subjective Pt reports that she is doing ok today. She is completing more workouts during the week and feels that her groin is improved some as well. Currently she has about 2/10 pain in her knees, but she just woke up.    Pertinent History cortizone injections 3 weeks ago: some improvement   Currently in Pain? Yes   Pain Score 2    Pain Location Knee   Pain Orientation Right;Left   Pain Descriptors / Indicators Aching;Dull   Pain Type Acute pain   Pain Radiating Towards none   Pain Onset More than a month ago   Pain Frequency Intermittent   Aggravating Factors  sit to stand   Pain Relieving Factors ice, stretch                          [facility] Adult PT Treatment/Exercise - 05/21/17 0001      Knee/Hip Exercises: Standing   Step Down Both;10 reps;Hand Hold: 0;Step Height: 6"   Step Down Limitations green TB around knees to decrease knee valgus   Other Standing Knee Exercises single leg firehydrant with green TB x10 reps each, firm surface      Knee/Hip  Exercises: Supine   Straight Leg Raise with External Rotation 1 set;20 reps;Both   Other Supine Knee/Hip Exercises bent knee bridge hold on 8" box with alt knee flexion x10 reps each; straight leg bridge hold with alt knee flexion x10 reps each     Knee/Hip Exercises: Sidelying   Hip ADduction 1 set;10 reps;Both   Hip ADduction Limitations with hip ER hold into flexion/extension     Knee/Hip Exercises: Prone   Hamstring Curl 1 set;10 reps   Hamstring Curl Limitations double green TB      Manual Therapy   Manual therapy comments orange roller    Soft tissue mobilization IASTM along Rt lateral quadriceps                 PT Education - 05/21/17 0902    Education provided Yes   Education Details implications for exercises completed during session; technique with therex    Person(s) Educated Patient   Methods Explanation;Tactile cues;Verbal cues   Comprehension Returned demonstration;Verbalized understanding          PT Short Term Goals - 05/19/17 6962      PT SHORT TERM GOAL #2   Title report a 25% reduction in knee pain and stiffness with riding in the car/driving  Baseline 40%    Status Achieved     PT SHORT TERM GOAL #3   Title descend steps with step-over-step with good control and 50% less knee pain   Status Achieved     PT SHORT TERM GOAL #4   Title perform [employer] workout with 30% less knee pain   Baseline intense workout with increased pain   Time 4   Period Weeks   Status On-going           PT Long Term Goals - 05/19/17 0809      PT LONG TERM GOAL #1   Title be independent in advanced HEP   Time 8   Period Weeks   Status On-going     PT LONG TERM GOAL #3   Title return to [employer] workout without need for modifications due to knee pain   Time 8   Period Weeks   Status On-going     PT LONG TERM GOAL #4   Title ride in the car/drive with 16% reduction in bil knee pain   Baseline 40% reduction   Time 8   Period Weeks   Status  On-going     PT LONG TERM GOAL #6   Title report < or = to 4/10 max knee pain with activity   Baseline up to 7/10 with exercise, 4/10 with regular activity   Time 8   Period Weeks   Status On-going               Plan - 05/21/17 0902    Clinical Impression Statement Pt continues to demonstrate improvements in knee pain and LE strength. She was able to complete step downs this session with increase in box height, noting improved valgus control as well compared to previous sessions. Noting significant hamstring and posterior chain weakness, evident by muscle shaking and fatigue reported during several new exercises completed today. Ended with soft tissue mobilization techniques to further improve quadriceps mobility specifically along the ITB. Pt reported no increase in her symptoms following today's activities. Will continue with current POC.    Rehab Potential Good   PT Frequency 2x / week   PT Duration 8 weeks   PT Treatment/Interventions ADLs/Self Care Home Management;Cryotherapy;Electrical Stimulation;Functional mobility training;Stair training;Gait training;Moist Heat;Therapeutic activities;Therapeutic exercise;Neuromuscular re-education;Patient/family education;Passive range of motion;Manual techniques;Dry needling;Taping   PT Next Visit Plan progression of proximal hip strength; hamstring and glute strengthening; hip flexibility   PT Home Exercise Plan added butterfly stretch and ball squeeze    Consulted and Agree with Plan of Care Patient      Patient will benefit from skilled therapeutic intervention in order to improve the following deficits and impairments:  Pain, Decreased strength, Impaired flexibility, Decreased activity tolerance, Decreased endurance, Increased muscle spasms, Decreased range of motion  Visit Diagnosis: Acute pain of left knee  Acute pain of right knee  Stiffness of left hip, not elsewhere classified  Stiffness of right hip, not elsewhere  classified  Muscle weakness (generalized)     Problem List Patient Active Problem List   Diagnosis Date Noted  . 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 [place], [place], [address] Phone: [phone]   Fax:  [phone]  Name: [name] MRN: [id number] Date of Birth: November 06, 1975

## 2017-05-25 ENCOUNTER — Ambulatory Visit: Payer: BLUE CROSS/BLUE SHIELD | Admitting: Physical Therapy

## 2017-05-25 ENCOUNTER — Encounter: Payer: Self-pay | Admitting: Physical Therapy

## 2017-05-25 DIAGNOSIS — M25562 Pain in left knee: Secondary | ICD-10-CM

## 2017-05-25 DIAGNOSIS — M25561 Pain in right knee: Secondary | ICD-10-CM

## 2017-05-25 DIAGNOSIS — M25652 Stiffness of left hip, not elsewhere classified: Secondary | ICD-10-CM | POA: Diagnosis not present

## 2017-05-25 DIAGNOSIS — M25651 Stiffness of right hip, not elsewhere classified: Secondary | ICD-10-CM

## 2017-05-25 DIAGNOSIS — M6281 Muscle weakness (generalized): Secondary | ICD-10-CM | POA: Diagnosis not present

## 2017-05-25 DIAGNOSIS — F418 Other specified anxiety disorders: Secondary | ICD-10-CM | POA: Diagnosis not present

## 2017-05-25 NOTE — Therapy (Signed)
[facility] [address] [place], [place], [address] Phone: [phone]   Fax:  [phone]  Physical Therapy Treatment  Patient Details  Name: [name] MRN: [id number] Date of Birth: 21-Jan-1976 Referring Provider: Eula Listen, MD  Encounter Date: 05/25/2017      PT End of Session - 05/25/17 1108    Visit Number 8   Number of Visits 10   Date for PT Re-Evaluation 06/25/17   PT Start Time 1103   PT Stop Time 1155   PT Time Calculation (min) 52 min   Activity Tolerance Patient tolerated treatment well   Behavior During Therapy [facility] for tasks assessed/performed      History reviewed. No pertinent past medical history.  Past Surgical History:  Procedure Laterality Date  . CHOLECYSTECTOMY    . LAPAROSCOPIC CHOLECYSTECTOMY  2010    There were no vitals filed for this visit.      Subjective Assessment - 05/25/17 1106    Subjective I am modifying my exercises during  my classes. Saturday was a good day.    Currently in Pain? Yes   Pain Score 4    Pain Location Knee   Pain Orientation Left   Pain Descriptors / Indicators Aching   Aggravating Factors  jumping   Pain Relieving Factors ice   Multiple Pain Sites No                         [facility] Adult PT Treatment/Exercise - 05/25/17 0001      Lumbar Exercises: Quadruped   Straight Leg Raise 10 reps;2 seconds  Bil: TC on RT for substitutions at pelvis   Other Quadruped Lumbar Exercises Donkey kicks bil 10x   TC for correct pelvic alignment/sub at lumbar     Knee/Hip Exercises: Stretches   Hip Flexor Stretch --  Bil with foam roll on sacrum, ankle circles prior for fascia   Other Knee/Hip Stretches quadriceps and gluteal release work with foam roll/tennis ball     Knee/Hip Exercises: Aerobic   Recumbent Bike L1 x 6 min  Concurrent review of status/goals with PTA     Cryotherapy   Number Minutes Cryotherapy 10 Minutes   Cryotherapy Location Knee  Bil post session   Type of Cryotherapy Ice pack                  PT Short Term Goals - 05/19/17 0808      PT SHORT TERM GOAL #2   Title report a 25% reduction in knee pain and stiffness with riding in the car/driving   Baseline 69%    Status Achieved     PT SHORT TERM GOAL #3   Title descend steps with step-over-step with good control and 50% less knee pain   Status Achieved     PT SHORT TERM GOAL #4   Title perform [employer] workout with 30% less knee pain   Baseline intense workout with increased pain   Time 4   Period Weeks   Status On-going           PT Long Term Goals - 05/19/17 0809      PT LONG TERM GOAL #1   Title be independent in advanced HEP   Time 8   Period Weeks   Status On-going     PT LONG TERM GOAL #3   Title return to [employer] without need for modifications due to knee pain  Time 8   Period Weeks   Status On-going     PT LONG TERM GOAL #4   Title ride in the car/drive with 16% reduction in bil knee pain   Baseline 40% reduction   Time 8   Period Weeks   Status On-going     PT LONG TERM GOAL #6   Title report < or = to 4/10 max knee pain with activity   Baseline up to 7/10 with exercise, 4/10 with regular activity   Time 8   Period Weeks   Status On-going               Plan - 05/25/17 1108    Clinical Impression Statement Pt was educated in ways to perorm self release techniques for her proximal quads and piriformis bilataterally.  She was encouraged to go much slower than she was and to not perform to the point of pain. Regarding the strength of her hamstrings and gluteals, in quadruped her Rt glutes performed very weak and she tended to substitute with her QL muscle. The Lt femure demonstrated more freedom of movement when compared to the RT but still weak / visable shaking when performed correctly.    Rehab Potential Good   PT Frequency 2x / week   PT Duration 8 weeks   PT  Treatment/Interventions ADLs/Self Care Home Management;Cryotherapy;Electrical Stimulation;Functional mobility training;Stair training;Gait training;Moist Heat;Therapeutic activities;Therapeutic exercise;Neuromuscular re-education;Patient/family education;Passive range of motion;Manual techniques;Dry needling;Taping   PT Next Visit Plan Add quadruped hamstring & gluteal strength to HEP. If that bothers her knees too much try performing at the countertop. Consider tall kneeling exercises for core and hip stability if that position works for her knees.    Consulted and Agree with Plan of Care Patient      Patient will benefit from skilled therapeutic intervention in order to improve the following deficits and impairments:  Pain, Decreased strength, Impaired flexibility, Decreased activity tolerance, Decreased endurance, Increased muscle spasms, Decreased range of motion  Visit Diagnosis: Acute pain of left knee  Acute pain of right knee  Stiffness of left hip, not elsewhere classified  Stiffness of right hip, not elsewhere classified  Muscle weakness (generalized)     Problem List Patient Active Problem List   Diagnosis Date Noted  . 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 [place], [place], [address] Phone: [phone]   Fax:  [phone]  Name: [name] MRN: [id number] Date of Birth: 09-09-1975

## 2017-05-27 ENCOUNTER — Ambulatory Visit: Payer: BLUE CROSS/BLUE SHIELD | Admitting: Physical Therapy

## 2017-05-27 ENCOUNTER — Encounter: Payer: Self-pay | Admitting: Physical Therapy

## 2017-05-27 DIAGNOSIS — M25651 Stiffness of right hip, not elsewhere classified: Secondary | ICD-10-CM

## 2017-05-27 DIAGNOSIS — M25561 Pain in right knee: Secondary | ICD-10-CM | POA: Diagnosis not present

## 2017-05-27 DIAGNOSIS — M25652 Stiffness of left hip, not elsewhere classified: Secondary | ICD-10-CM

## 2017-05-27 DIAGNOSIS — M25562 Pain in left knee: Secondary | ICD-10-CM | POA: Diagnosis not present

## 2017-05-27 DIAGNOSIS — M6281 Muscle weakness (generalized): Secondary | ICD-10-CM | POA: Diagnosis not present

## 2017-05-27 NOTE — Therapy (Signed)
[facility] [address], [address] [place], [place], [address] Phone: [phone]   Fax:  [phone]  Physical Therapy Treatment  Patient Details  Name: [name] MRN: [id number] Date of Birth: 12/21/1975 Referring Provider: Eula ListenMcKinley, Dominic, MD  Encounter Date: 05/27/2017      PT End of Session - 05/27/17 1403    Visit Number 9   Number of Visits 10   Date for PT Re-Evaluation 06/25/17   PT Start Time 1403   PT Stop Time 1451   PT Time Calculation (min) 48 min   Activity Tolerance Patient tolerated treatment well   Behavior [facility] assessed/performed      History reviewed. No pertinent past medical history.  Past Surgical History:  Procedure Laterality Date  . CHOLECYSTECTOMY    . LAPAROSCOPIC CHOLECYSTECTOMY  2010    There were no vitals filed for this visit.      Subjective Assessment - 05/27/17 1406    Subjective I felt great after last session. I only have tight muscles today in my hamstrings.    Currently in Pain? No/denies   Multiple Pain Sites No                         [facility] Adult PT Treatment/Exercise - 05/27/17 0001      Lumbar Exercises: Quadruped   Straight Leg Raise 20 reps;3 seconds   Other Quadruped Lumbar Exercises Donkey kicks bil 10x2   TC for correct pelvic alignment/sub at lumbar     Knee/Hip Exercises: Stretches   Hip Flexor Stretch --  Bil with foam roll on sacrum, ankle circles prior for fascia   Other Knee/Hip Stretches quadriceps and gluteal release work with foam roll/tennis ball     Knee/Hip Exercises: Aerobic   Recumbent Bike L2 x 8 min  Review of status                PT Education - 05/27/17 1454    Education provided Yes   Education Details HEP to include quadruped hip extension with knee straight and knee bent for glutes   Person(s) Educated Patient   Methods Explanation;Demonstration;Tactile cues;Verbal cues   Comprehension Verbalized understanding;Returned demonstration          PT Short Term Goals - 05/19/17 0808      PT SHORT TERM GOAL #2   Title report a 25% reduction in knee pain and stiffness with riding in the car/driving   Baseline 69%40%    Status Achieved     PT SHORT TERM GOAL #3   Title descend steps with step-over-step with good control and 50% less knee pain   Status Achieved     PT SHORT TERM GOAL #4   Title perform [employer] workout with 30% less knee pain   Baseline intense workout with increased pain   Time 4   Period Weeks   Status On-going           PT Long Term Goals - 05/19/17 0809      PT LONG TERM GOAL #1   Title be independent in advanced HEP   Time 8   Period Weeks   Status On-going     PT LONG TERM GOAL #3   Title return to [employer] workout without need for modifications due to knee pain   Time 8   Period Weeks   Status On-going     PT LONG TERM GOAL #4  Title ride in the car/drive with 16% reduction in bil knee pain   Baseline 40% reduction   Time 8   Period Weeks   Status On-going     PT LONG TERM GOAL #6   Title report < or = to 4/10 max knee pain with activity   Baseline up to 7/10 with exercise, 4/10 with regular activity   Time 8   Period Weeks   Status On-going               Plan - 05/27/17 1403    Clinical Impression Statement Pt was able to do full ex class last night with no increased knee pain. She is learning how to move her femur on her pelvis better as her hips/quads release and her posterior chain strengthens. Increased ammount of work today.    Rehab Potential Good   PT Frequency 2x / week   PT Duration 8 weeks   PT Treatment/Interventions ADLs/Self Care Home Management;Cryotherapy;Electrical Stimulation;Functional mobility training;Stair training;Gait training;Moist Heat;Therapeutic activities;Therapeutic exercise;Neuromuscular re-education;Patient/family education;Passive range of motion;Manual techniques;Dry  needling;Taping      Patient will benefit from skilled therapeutic intervention in order to improve the following deficits and impairments:  Pain, Decreased strength, Impaired flexibility, Decreased activity tolerance, Decreased endurance, Increased muscle spasms, Decreased range of motion  Visit Diagnosis: Acute pain of left knee  Acute pain of right knee  Stiffness of left hip, not elsewhere classified  Stiffness of right hip, not elsewhere classified  Muscle weakness (generalized)     Problem List Patient Active Problem List   Diagnosis Date Noted  . 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, [address] [place], [place], [address] Phone: [phone]   Fax:  [phone]  Name: RENESHA LIZAMA MRN: [id number] Date of Birth: 09-26-1975

## 2017-06-01 ENCOUNTER — Ambulatory Visit: Payer: BLUE CROSS/BLUE SHIELD

## 2017-06-01 DIAGNOSIS — M25562 Pain in left knee: Secondary | ICD-10-CM

## 2017-06-01 DIAGNOSIS — M25652 Stiffness of left hip, not elsewhere classified: Secondary | ICD-10-CM | POA: Diagnosis not present

## 2017-06-01 DIAGNOSIS — M6281 Muscle weakness (generalized): Secondary | ICD-10-CM | POA: Diagnosis not present

## 2017-06-01 DIAGNOSIS — M25651 Stiffness of right hip, not elsewhere classified: Secondary | ICD-10-CM | POA: Diagnosis not present

## 2017-06-01 DIAGNOSIS — M25561 Pain in right knee: Secondary | ICD-10-CM

## 2017-06-01 NOTE — Therapy (Signed)
[facility] [address] [place], [place], [address] Phone: [phone]   Fax:  [phone]  Physical Therapy Treatment  Patient Details  Name: [name] MRN: [id number] Date of Birth: 04-06-1976 Referring Provider: Eula Listen, MD  Encounter Date: 06/01/2017      PT End of Session - 06/01/17 0929    Visit Number 10   Date for PT Re-Evaluation 06/25/17   PT Start Time 0847   PT Stop Time 0930   PT Time Calculation (min) 43 min   Activity Tolerance Patient tolerated treatment well   Behavior During Therapy [facility] for tasks assessed/performed      History reviewed. No pertinent past medical history.  Past Surgical History:  Procedure Laterality Date  . CHOLECYSTECTOMY    . LAPAROSCOPIC CHOLECYSTECTOMY  2010    There were no vitals filed for this visit.      Subjective Assessment - 06/01/17 0850    Subjective Pt reports 65% overall improvement in symptoms since the start of care.     Currently in Pain? Yes   Pain Score 3    Pain Location Knee   Pain Orientation Left   Pain Descriptors / Indicators Aching   Pain Type Acute pain   Pain Onset More than a month ago   Pain Frequency Intermittent   Aggravating Factors  jumping, squatting   Pain Relieving Factors ice, not jumping                         [facility] Adult PT Treatment/Exercise - 06/01/17 0001      Lumbar Exercises: Quadruped   Straight Leg Raise 20 reps;3 seconds   Other Quadruped Lumbar Exercises Donkey kicks bil 10x2   TC for correct pelvic alignment/sub at lumbar     Knee/Hip Exercises: Stretches   Active Hamstring Stretch Both;3 reps;20 seconds  using strap   Other Knee/Hip Stretches quadriceps and gluteal release work with foam roll/tennis ball     Knee/Hip Exercises: Aerobic   Elliptical Level 4, ramp 3 x 6 minutes     Knee/Hip Exercises: Standing   Step Down Both;10 reps;Hand Hold: 0;Step Height: 6"   Rocker Board Limitations gastroc stretch 5x10"   Walking with Sports Cord 25# forward x 10 each     Knee/Hip Exercises: Seated   Sit to Sand 2 sets;without UE support;Other (comment);10 reps  standing on black pad with ball squeeze between knees                  PT Short Term Goals - 06/01/17 0850      PT SHORT TERM GOAL #4   Title perform [employer] workout with 30% less knee pain   Status Achieved           PT Long Term Goals - 05/19/17 0809      PT LONG TERM GOAL #1   Title be independent in advanced HEP   Time 8   Period Weeks   Status On-going     PT LONG TERM GOAL #3   Title return to [employer] without need for modifications due to knee pain   Time 8   Period Weeks   Status On-going     PT LONG TERM GOAL #4   Title ride in the car/drive with 69% reduction in bil knee pain   Baseline 40% reduction   Time 8   Period Weeks   Status On-going  PT LONG TERM GOAL #6   Title report < or = to 4/10 max knee pain with activity   Baseline up to 7/10 with exercise, 4/10 with regular activity   Time 8   Period Weeks   Status On-going               Plan - 06/01/17 [id number]    Clinical Impression Statement Pt reports 65% overall improvement in knee symptoms since the start of care.  Pt is working on hamstring flexibility and release work for [employer] Energy[employer]uteals and quads at home.  Pt is able to participate in [facility] workouts daily.  Pt will benefit from skilled PT for LE strength, flexibility and release work.     PT Frequency 2x / week   PT Duration 8 weeks   PT Treatment/Interventions ADLs/Self Care Home Management;Cryotherapy;Electrical Stimulation;Functional mobility training;Stair training;Gait training;Moist Heat;Therapeutic activities;Therapeutic exercise;Neuromuscular re-education;Patient/family education;Passive range of motion;Manual techniques;Dry needling;Taping   PT Next Visit Plan hip/knee/ankle stability, flexibility, release work    Financial plannerConsulted and Agree with Plan of Care Patient      Patient will benefit from skilled therapeutic intervention in order to improve the following deficits and impairments:  Pain, Decreased strength, Impaired flexibility, Decreased activity tolerance, Decreased endurance, Increased muscle spasms, Decreased range of motion  Visit Diagnosis: Acute pain of left knee  Acute pain of right knee  Stiffness of left hip, not elsewhere classified  Stiffness of right hip, not elsewhere classified  Muscle weakness (generalized)     Problem List Patient Active Problem List   Diagnosis Date Noted  . 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 [place], [place], [address] Phone: [phone]   Fax:  [phone]  Name: [name] MRN: [id number] Date of Birth: 11/01/1975

## 2017-06-02 DIAGNOSIS — M722 Plantar fascial fibromatosis: Secondary | ICD-10-CM | POA: Diagnosis not present

## 2017-06-04 ENCOUNTER — Ambulatory Visit: Payer: BLUE CROSS/BLUE SHIELD

## 2017-06-04 DIAGNOSIS — M25562 Pain in left knee: Secondary | ICD-10-CM | POA: Diagnosis not present

## 2017-06-04 DIAGNOSIS — M25561 Pain in right knee: Secondary | ICD-10-CM

## 2017-06-04 DIAGNOSIS — M25652 Stiffness of left hip, not elsewhere classified: Secondary | ICD-10-CM

## 2017-06-04 DIAGNOSIS — M25651 Stiffness of right hip, not elsewhere classified: Secondary | ICD-10-CM

## 2017-06-04 DIAGNOSIS — M6281 Muscle weakness (generalized): Secondary | ICD-10-CM

## 2017-06-04 NOTE — Therapy (Signed)
[facility] [address], [address] [place], [place], [address] Phone: [phone]   Fax:  [phone]  Physical Therapy Treatment  Patient Details  Name: [name] MRN: [id number] Date of Birth: 07/08/1976 Referring Provider: Eula Listen, MD  Encounter Date: 06/04/2017      PT End of Session - 06/04/17 0930    Visit Number 11   Date for PT Re-Evaluation 06/25/17   PT Start Time 0847   PT Stop Time 0931   PT Time Calculation (min) 44 min   Activity Tolerance Patient tolerated treatment well   Behavior During Therapy [facility] for tasks assessed/performed      History reviewed. No pertinent past medical history.  Past Surgical History:  Procedure Laterality Date  . CHOLECYSTECTOMY    . LAPAROSCOPIC CHOLECYSTECTOMY  2010    There were no vitals filed for this visit.      Subjective Assessment - 06/04/17 0855    Subjective Pt reports 65% overall improvement.  Muscles were sore after last session.   Currently in Pain? Yes   Pain Score 4    Pain Location Knee   Pain Orientation Left;Right                         [facility] Adult PT Treatment/Exercise - 06/04/17 0001      Knee/Hip Exercises: Stretches   Active Hamstring Stretch Both;3 reps;20 seconds  using strap   Other Knee/Hip Stretches quadriceps and gluteal release work with foam roll/tennis ball     Knee/Hip Exercises: Aerobic   Elliptical Level 4, ramp 3 x 6 minutes     Knee/Hip Exercises: Standing   Forward Step Up Both;2 sets;10 reps  Bosu   Step Down Both;10 reps;Hand Hold: 0;Step Height: 6"   Rocker Board Limitations gastroc stretch 5x10"   Walking with Sports Cord 25# forward and reverse x 10 each     Knee/Hip Exercises: Seated   Sit to Sand 2 sets;without UE support;Other (comment);10 reps  standing on black pad with ball squeeze between knees                  PT Short Term Goals - 06/01/17 0850      PT SHORT  TERM GOAL #4   Title perform [employer] workout with 30% less knee pain   Status Achieved           PT Long Term Goals - 05/19/17 0809      PT LONG TERM GOAL #1   Title be independent in advanced HEP   Time 8   Period Weeks   Status On-going     PT LONG TERM GOAL #3   Title return to [employer] workout without need for modifications due to knee pain   Time 8   Period Weeks   Status On-going     PT LONG TERM GOAL #4   Title ride in the car/drive with 69% reduction in bil knee pain   Baseline 40% reduction   Time 8   Period Weeks   Status On-going     PT LONG TERM GOAL #6   Title report < or = to 4/10 max knee pain with activity   Baseline up to 7/10 with exercise, 4/10 with regular activity   Time 8   Period Weeks   Status On-going               Plan - 06/04/17 6295  Clinical Impression Statement Pt reports 65% overall improvement in knee systems since the start of care.  Pt is working on hamstring flexibility and release work for [employer] Energy[employer]uteals and quads at home. Pt is able to participate in [employer] workout daily.  Pt will benefit from skilled PT for LE strength, flexibility and release work.     Rehab Potential Good   PT Frequency 2x / week   PT Duration 8 weeks   PT Treatment/Interventions ADLs/Self Care Home Management;Cryotherapy;Electrical Stimulation;Functional mobility training;Stair training;Gait training;Moist Heat;Therapeutic activities;Therapeutic exercise;Neuromuscular re-education;Patient/family education;Passive range of motion;Manual techniques;Dry needling;Taping   PT Next Visit Plan hip/knee/ankle stability, flexibility, release work   Financial plannerConsulted and Agree with Plan of Care Patient      Patient will benefit from skilled therapeutic intervention in order to improve the following deficits and impairments:  Pain, Decreased strength, Impaired flexibility, Decreased activity tolerance, Decreased endurance, Increased muscle spasms, Decreased range of  motion  Visit Diagnosis: Acute pain of left knee  Acute pain of right knee  Stiffness of left hip, not elsewhere classified  Stiffness of right hip, not elsewhere classified  Muscle weakness (generalized)     Problem List Patient Active Problem List   Diagnosis Date Noted  . 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, [address] [place], [place], [address] Phone: [phone]   Fax:  [phone]  Name: Edgardo Roysrica A Macnair MRN: [id number] Date of Birth: 10/05/1975

## 2017-06-10 ENCOUNTER — Encounter: Payer: Self-pay | Admitting: Physical Therapy

## 2017-06-10 ENCOUNTER — Ambulatory Visit: Payer: BLUE CROSS/BLUE SHIELD | Admitting: Physical Therapy

## 2017-06-10 DIAGNOSIS — M25561 Pain in right knee: Secondary | ICD-10-CM | POA: Diagnosis not present

## 2017-06-10 DIAGNOSIS — M6281 Muscle weakness (generalized): Secondary | ICD-10-CM

## 2017-06-10 DIAGNOSIS — M25562 Pain in left knee: Secondary | ICD-10-CM | POA: Diagnosis not present

## 2017-06-10 DIAGNOSIS — M25652 Stiffness of left hip, not elsewhere classified: Secondary | ICD-10-CM

## 2017-06-10 DIAGNOSIS — M25651 Stiffness of right hip, not elsewhere classified: Secondary | ICD-10-CM

## 2017-06-10 NOTE — Therapy (Signed)
[facility] [address], [address] [place], [place], [address] Phone: [phone]   Fax:  [phone]  Physical Therapy Treatment  Patient Details  Name: [name] MRN: [id number] Date of Birth: 11/28/1975 Referring Provider: Eula ListenMcKinley, Dominic, MD  Encounter Date: 06/10/2017      PT End of Session - 06/10/17 0849    Visit Number 12   Date for PT Re-Evaluation 06/25/17   PT Start Time 0848   PT Stop Time 0926   PT Time Calculation (min) 38 min   Activity Tolerance Patient tolerated treatment well   Behavior During Therapy [facility] for tasks assessed/performed      History reviewed. No pertinent past medical history.  Past Surgical History:  Procedure Laterality Date  . CHOLECYSTECTOMY    . LAPAROSCOPIC CHOLECYSTECTOMY  2010    There were no vitals filed for this visit.      Subjective Assessment - 06/10/17 0851    Subjective Did "leg" day in class. Just some soreness in quads, knees were a little sore but not bad. Had not done leg day in a year.    Currently in Pain? Yes  Knees are a little sore.    Multiple Pain Sites No                         [facility] Adult PT Treatment/Exercise - 06/10/17 0001      Lumbar Exercises: Quadruped   Straight Leg Raise 10 reps  hip extension, knee flex/ext/glute lifts Bil     Knee/Hip Exercises: Stretches   Hip Flexor Stretch Both;2 reps;30 seconds  Knee bent and straight   Other Knee/Hip Stretches quadriceps and gluteal release work with foam roll/tennis ball     Knee/Hip Exercises: Aerobic   Recumbent Bike L2 x 5 min     Cryotherapy   Number Minutes Cryotherapy 10 Minutes   Cryotherapy Location --  Bil knees                  PT Short Term Goals - 06/01/17 0850      PT SHORT TERM GOAL #4   Title perform [employer] workout with 30% less knee pain   Status Achieved           PT Long Term Goals - 05/19/17 0809      PT LONG TERM GOAL  #1   Title be independent in advanced HEP   Time 8   Period Weeks   Status On-going     PT LONG TERM GOAL #3   Title return to [employer] workout without need for modifications due to knee pain   Time 8   Period Weeks   Status On-going     PT LONG TERM GOAL #4   Title ride in the car/drive with 69%70% reduction in bil knee pain   Baseline 40% reduction   Time 8   Period Weeks   Status On-going     PT LONG TERM GOAL #6   Title report < or = to 4/10 max knee pain with activity   Baseline up to 7/10 with exercise, 4/10 with regular activity   Time 8   Period Weeks   Status On-going               Plan - 06/10/17 0849    Clinical Impression Statement pt was able to participate in her exercise class yesterday which was a heavily concentrated leg day including squats,lunges  and other knee strengthening exercises. This was tehe first leg class she was able to do in 1 year,  Treatment today focused on quad recovery mainly.    Rehab Potential Good   PT Frequency 2x / week   PT Duration 8 weeks   PT Treatment/Interventions ADLs/Self Care Home Management;Cryotherapy;Electrical Stimulation;Functional mobility training;Stair training;Gait training;Moist Heat;Therapeutic activities;Therapeutic exercise;Neuromuscular re-education;Patient/family education;Passive range of motion;Manual techniques;Dry needling;Taping   PT Next Visit Plan hip/knee/ankle stability, flexibility, release work   Financial planner with Plan of Care Patient      Patient will benefit from skilled therapeutic intervention in order to improve the following deficits and impairments:  Pain, Decreased strength, Impaired flexibility, Decreased activity tolerance, Decreased endurance, Increased muscle spasms, Decreased range of motion  Visit Diagnosis: Acute pain of left knee  Acute pain of right knee  Stiffness of left hip, not elsewhere classified  Stiffness of right hip, not elsewhere classified  Muscle  weakness (generalized)     Problem List Patient Active Problem List   Diagnosis Date Noted  . 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 [place], [place], [address] Phone: [phone]   Fax:  [phone]  Name: JULY LINAM MRN: [id number] Date of Birth: 06/05/1976

## 2017-06-12 ENCOUNTER — Ambulatory Visit: Payer: BLUE CROSS/BLUE SHIELD | Attending: Family Medicine | Admitting: Physical Therapy

## 2017-06-12 DIAGNOSIS — M25561 Pain in right knee: Secondary | ICD-10-CM

## 2017-06-12 DIAGNOSIS — M25652 Stiffness of left hip, not elsewhere classified: Secondary | ICD-10-CM

## 2017-06-12 DIAGNOSIS — M25562 Pain in left knee: Secondary | ICD-10-CM | POA: Insufficient documentation

## 2017-06-12 DIAGNOSIS — M6281 Muscle weakness (generalized): Secondary | ICD-10-CM

## 2017-06-12 DIAGNOSIS — M25651 Stiffness of right hip, not elsewhere classified: Secondary | ICD-10-CM | POA: Insufficient documentation

## 2017-06-12 NOTE — Therapy (Signed)
[facility] [address], [address] [place], [place], [address] Phone: [phone]   Fax:  [phone]  Physical Therapy Treatment  Patient Details  Name: [name] MRN: [id number] Date of Birth: 01-01-1976 Referring Provider: Eula Listen, MD  Encounter Date: 06/12/2017      PT End of Session - 06/12/17 0803    Visit Number 13   Date for PT Re-Evaluation 06/25/17   PT Start Time 0758   PT Stop Time 0855   PT Time Calculation (min) 57 min   Activity Tolerance Patient tolerated treatment well   Behavior During Therapy [facility] for tasks assessed/performed      No past medical history on file.  Past Surgical History:  Procedure Laterality Date  . CHOLECYSTECTOMY    . LAPAROSCOPIC CHOLECYSTECTOMY  2010    There were no vitals filed for this visit.      Subjective Assessment - 06/12/17 0805    Subjective HAms and glutes were petty sore, but pt also went on to do another exercise class that  same evening. During this class knees started to hurt, had to stop and modify.   Currently in Pain? Yes   Pain Score 2    Pain Location Knee   Pain Orientation Left;Right   Pain Descriptors / Indicators Dull   Aggravating Factors  too much squatting   Pain Relieving Factors ice, modifications   Multiple Pain Sites No                         [facility] Adult PT Treatment/Exercise - 06/12/17 0001      Knee/Hip Exercises: Stretches   Active Hamstring Stretch Both;3 reps;20 seconds  using strap   Active Hamstring Stretch Limitations Hamstring release seated with spikey ball bil   Passive Hamstring Stretch --  Adductor, ITband stretch with strap bil   Hip Flexor Stretch Both;2 reps;30 seconds  Knee bent and straight   Other Knee/Hip Stretches quadriceps and gluteal release work with foam roll/tennis ball     Knee/Hip Exercises: Aerobic   Recumbent Bike L2 x 6 min  Review of status     Cryotherapy   Number Minutes Cryotherapy 10 Minutes   Cryotherapy Location --  Bil knees                  PT Short Term Goals - 06/01/17 0850      PT SHORT TERM GOAL #4   Title perform [employer] workout with 30% less knee pain   Status Achieved           PT Long Term Goals - 05/19/17 0809      PT LONG TERM GOAL #1   Title be independent in advanced HEP   Time 8   Period Weeks   Status On-going     PT LONG TERM GOAL #3   Title return to [employer] without need for modifications due to knee pain   Time 8   Period Weeks   Status On-going     PT LONG TERM GOAL #4   Title ride in the car/drive with 69% reduction in bil knee pain   Baseline 40% reduction   Time 8   Period Weeks   Status On-going     PT LONG TERM GOAL #6   Title report < or = to 4/10 max knee pain with activity   Baseline up to 7/10 with exercise, 4/10 with regular activity  Time 8   Period Weeks   Status On-going               Plan - 06/12/17 0803    Clinical Impression Statement Pt had good start to week, but overdid her exercises witin a 48 hr period: 2 intense exercise classes and her PT sessions. Knees became sore but were some better the next day and after icing treatments.  Pt's hip were pretty tight after al lher workouts, time was spent to release and then stretch., this made her feel some better.    Rehab Potential Good   PT Frequency 2x / week   PT Duration 8 weeks   PT Treatment/Interventions ADLs/Self Care Home Management;Cryotherapy;Electrical Stimulation;Functional mobility training;Stair training;Gait training;Moist Heat;Therapeutic activities;Therapeutic exercise;Neuromuscular re-education;Patient/family education;Passive range of motion;Manual techniques;Dry needling;Taping   PT Next Visit Plan Pt out of town for new job training, her order will expire when she is out of town. She was instructed to schedule an ERO/reassessment visit with [name] upon returrning if she  wants to further her PT sessions.    Consulted and Agree with Plan of Care Patient      Patient will benefit from skilled therapeutic intervention in order to improve the following deficits and impairments:  Pain, Decreased strength, Impaired flexibility, Decreased activity tolerance, Decreased endurance, Increased muscle spasms, Decreased range of motion  Visit Diagnosis: Acute pain of left knee  Acute pain of right knee  Stiffness of left hip, not elsewhere classified  Stiffness of right hip, not elsewhere classified  Muscle weakness (generalized)     Problem List Patient Active Problem List   Diagnosis Date Noted  . 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, [address] [place], [place], [address] Phone: [phone]   Fax:  [phone]  Name: [name] MRN: [id number] Date of Birth: 01/26/1976

## 2017-06-17 ENCOUNTER — Encounter: Payer: BLUE CROSS/BLUE SHIELD | Admitting: Physical Therapy

## 2017-07-07 ENCOUNTER — Ambulatory Visit: Payer: BLUE CROSS/BLUE SHIELD

## 2017-07-13 ENCOUNTER — Ambulatory Visit: Payer: BLUE CROSS/BLUE SHIELD | Attending: Family Medicine

## 2017-07-13 DIAGNOSIS — M25652 Stiffness of left hip, not elsewhere classified: Secondary | ICD-10-CM | POA: Insufficient documentation

## 2017-07-13 DIAGNOSIS — M25651 Stiffness of right hip, not elsewhere classified: Secondary | ICD-10-CM | POA: Diagnosis not present

## 2017-07-13 DIAGNOSIS — M6281 Muscle weakness (generalized): Secondary | ICD-10-CM | POA: Insufficient documentation

## 2017-07-13 DIAGNOSIS — M25561 Pain in right knee: Secondary | ICD-10-CM | POA: Diagnosis not present

## 2017-07-13 DIAGNOSIS — M25562 Pain in left knee: Secondary | ICD-10-CM | POA: Diagnosis not present

## 2017-07-13 NOTE — Patient Instructions (Addendum)
Single Leg - Eyes Open    Holding support, lift right leg while maintaining balance over other leg. Progress to removing hands from support surface for longer periods of time. Hold_20-30 ___ seconds. Repeat __3-5__ times per session. Do 3___ sessions per day.  Copyright  VHI. All rights reserved.  Wall-Sit    With back pressed against a wall, bend knees and slide buttocks down until knees are about 90 or more. Stay in this position for 10___ breaths. Exhale while straightening legs. Repeat _10__ times. Do _2__ times per day.  Copyright  VHI. All rights reserved.  [facility] [address], [address] [place], [place] [address] Phone # [phone] Fax [phone]

## 2017-07-13 NOTE — Therapy (Addendum)
[facility] [address], [address] [place], [place], [address] Phone: [phone]   Fax:  [phone]  Physical Therapy Treatment  Patient Details  Name: [name] MRN: [id number] Date of Birth: [birth year] Referring Provider: Eula Listen, MD   Encounter Date: 07/13/2017  PT End of Session - 07/13/17 1528    Visit Number  14    Date for PT Re-Evaluation  09/04/17    PT Start Time  1449    PT Stop Time  1527    PT Time Calculation (min)  38 min    Activity Tolerance  Patient tolerated treatment well    Behavior During Therapy  [facility] for tasks assessed/performed       History reviewed. No pertinent past medical history.  Past Surgical History:  Procedure Laterality Date  . CHOLECYSTECTOMY    . LAPAROSCOPIC CHOLECYSTECTOMY  2010    There were no vitals filed for this visit.  Subjective Assessment - 07/13/17 1453    Subjective  Pt started a new job and hasnt been able to attend PT.  Pt reports 60% overall improvement.      Currently in Pain?  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      Cognition   Overall Cognitive Status  Within Functional Limits for tasks assessed      Observation/Other Assessments   Focus on Therapeutic Outcomes (FOTO)   38% limitation      Strength   Overall Strength Comments  Bil hips 4+/5, knees 5/5 without pain, 5/5 hamstrings                  [facility] Adult PT Treatment/Exercise - 07/13/17 0001      Knee/Hip Exercises: Stretches   Active Hamstring Stretch  Both;3 reps;20 seconds using strap      Knee/Hip Exercises: Standing   Wall Squat  20 reps;5 seconds ball squeeze    SLS  on blue pod 10x20 seconds on Lt instability on Lt    Other Standing Knee Exercises   vector lunges Lt and Rt with sliders 2x10             PT Education - 07/13/17 1525    Education provided  Yes    Education Details  wall squats, single leg stance    Person(s) Educated  Patient    Methods  Explanation;Demonstration;Handout    Comprehension  Verbalized understanding;Returned demonstration       PT Short Term Goals - 06/01/17 0850      PT SHORT TERM GOAL #4   Title  perform [employer] workout with 30% less knee pain    Status  Achieved        PT Long Term Goals - 07/13/17 1455      PT LONG TERM GOAL #1   Title  be independent in advanced HEP    Time  8    Period  Weeks    Status  On-going      PT LONG TERM GOAL #2   Title  reduce FOTO to < or = to 33% limitation    Baseline  38% limitation    Time  8  Period  Weeks    Status  On-going      PT LONG TERM GOAL #3   Title  return to [employer] workout without need for modifications due to knee pain    Baseline  not able to squat or jump    Time  8    Period  Weeks    Status  On-going      PT LONG TERM GOAL #4   Title  ride in the car/drive with 16%70% reduction in bil knee pain    Baseline  60% improvement, worse with long drive    Time  8    Period  Weeks    Status  On-going      PT LONG TERM GOAL #5   Title  descend steps without pain and desmontrate eccentric control with step-over-step gait    Baseline  reduced pain, still with step-to gait    Time  8    Period  Weeks    Status  On-going      PT LONG TERM GOAL #6   Title  report < or = to 4/10 max knee pain with activity    Baseline  up to 6/10 with exercise, 4/10 max with regular activity    Time  8    Period  Weeks    Status  On-going            Plan - 07/13/17 1510    Clinical Impression Statement  Pt with limited attendance over the past month due to starting a new job. Pt has not been able to exercise as much due to schedule.  Pt reports 60% overall improvement in symptoms since the start of care.  Bil hip strength has  improved to 4+/5.  Pt is limited to squatting and descending steps due to pain.  Pt has modified her boot camp routine to reduce squatting and jumping.  Pt demonstrates instability on the Rt with single leg stance on blue pod. Pt will benefit from skilled PT for 1x every week to every other week to improve strength with eccentric motion, squatting and low impact jumping.      Rehab Potential  Good    PT Frequency  1x / week    PT Duration  8 weeks    PT Treatment/Interventions  ADLs/Self Care Home Management;Cryotherapy;Electrical Stimulation;Functional mobility training;Stair training;Gait training;Moist Heat;Therapeutic activities;Therapeutic exercise;Neuromuscular re-education;Patient/family education;Passive range of motion;Manual techniques;Dry needling;Taping    PT Next Visit Plan  1x/wk or every other week.  Address Lt hip and knee stability, flexibility and eccentric motion      Recommended Other Services  initial cert signed.  Recert sent 07/13/17    Consulted and Agree with Plan of Care  Patient       Patient will benefit from skilled therapeutic intervention in order to improve the following deficits and impairments:  Pain, Decreased strength, Impaired flexibility, Decreased activity tolerance, Decreased endurance, Increased muscle spasms, Decreased range of motion  Visit Diagnosis: Acute pain of left knee - Plan: PT plan of care cert/re-cert  Acute pain of right knee - Plan: PT plan of care cert/re-cert  Stiffness of left hip, not elsewhere classified - Plan: PT plan of care cert/re-cert  Stiffness of right hip, not elsewhere classified - Plan: PT plan of care cert/re-cert  Muscle weakness (generalized) - Plan: PT plan of care cert/re-cert     Problem List Patient Active Problem List   Diagnosis Date Noted  . 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, [address] [place], [place], [address] Phone: [phone]   Fax:  [phone]  Name: [name] MRN: [id number] Date of Birth: 09/28/1975

## 2017-07-14 DIAGNOSIS — Z30432 Encounter for removal of intrauterine contraceptive device: Secondary | ICD-10-CM | POA: Diagnosis not present

## 2017-07-14 DIAGNOSIS — Z01419 Encounter for gynecological examination (general) (routine) without abnormal findings: Secondary | ICD-10-CM | POA: Diagnosis not present

## 2017-08-12 ENCOUNTER — Encounter: Payer: BLUE CROSS/BLUE SHIELD | Admitting: Physical Therapy

## 2017-08-19 ENCOUNTER — Encounter: Payer: BLUE CROSS/BLUE SHIELD | Admitting: Physical Therapy

## 2017-08-26 ENCOUNTER — Ambulatory Visit: Payer: No Typology Code available for payment source | Attending: Family Medicine | Admitting: Physical Therapy

## 2017-08-26 ENCOUNTER — Encounter: Payer: Self-pay | Admitting: Physical Therapy

## 2017-08-26 DIAGNOSIS — M25561 Pain in right knee: Secondary | ICD-10-CM | POA: Insufficient documentation

## 2017-08-26 DIAGNOSIS — M25652 Stiffness of left hip, not elsewhere classified: Secondary | ICD-10-CM | POA: Insufficient documentation

## 2017-08-26 DIAGNOSIS — M25651 Stiffness of right hip, not elsewhere classified: Secondary | ICD-10-CM | POA: Insufficient documentation

## 2017-08-26 DIAGNOSIS — M6281 Muscle weakness (generalized): Secondary | ICD-10-CM | POA: Insufficient documentation

## 2017-08-26 DIAGNOSIS — M25562 Pain in left knee: Secondary | ICD-10-CM | POA: Insufficient documentation

## 2017-08-26 NOTE — Patient Instructions (Signed)
Hip stretch for external rotation:  -Lay on your back with your legs straight -Level 1 is to put the bottom of your Lt foot along the inner calf of the RT leg ( actually press them together.) Let your Lt thigh "flop out" to the bed, keeping your pelvis level. Relax and breathe in this position for a min of 1 min up to 3 min. You are trying to really soften the hip joint and soft tissues around the joint.   -Level 2 is when you put the LT foot on top of the Rt shin bone and do the same thing.  After the stretch, you need to move in that range of motion. 10x, slide the LT heel up and down the RT leg in that "flopped out/rotated" shape.   1x a day should be plenty.    #2 Do the clamshell exercise: Check your set up, we lengthened you out some and brought the heels closer to the glutes. LIFT the bottom waist a little feeling a lengthening to the sides of your body. Pull the core up and in.....THEN perform the clamshell exercise slowly. Do not move your trunk and try not to muscle it too much. Teach your hips to move with more ease and less tension...hard to do! Do 10 excelllent ones! Once you get better at this, then add a theraband for resistance.

## 2017-08-26 NOTE — Therapy (Signed)
[facility] [address], [address] [place], [place], [address] Phone: [phone]   Fax:  [phone]  Physical Therapy Treatment  Patient Details  Name: [name] MRN: [id number] Date of Birth: 12/14/1975 Referring Provider: Eula ListenMcKinley, Dominic, MD   Encounter Date: 08/26/2017  PT End of Session - 08/26/17 1233    Visit Number  15    Date for PT Re-Evaluation  09/04/17    PT Start Time  1230    PT Stop Time  1310    PT Time Calculation (min)  40 min    Activity Tolerance  Patient tolerated treatment well    Behavior During Therapy  [facility] assessed/[facility]       History reviewed. No pertinent past medical history.  Past Surgical History:  Procedure Laterality Date  . CHOLECYSTECTOMY    . LAPAROSCOPIC CHOLECYSTECTOMY  2010    There were no vitals filed for this visit.  Subjective Assessment - 08/26/17 1235    Subjective  My Lt hip is killing me. Not sure why....sleeping is most painful.    Currently in Pain?  Yes    Pain Score  5     Pain Location  Hip    Pain Orientation  Left    Pain Descriptors / Indicators  Discomfort    Aggravating Factors   at night    Pain Relieving Factors  ice, stretching    Multiple Pain Sites  No                      [facility] Adult PT Treatment/Exercise - 08/26/17 0001      Knee/Hip Exercises: Aerobic   Recumbent Bike  L2 x 10 min Review of status & educated in using pilows at night bt knee             PT Education - 08/26/17 1245    Education provided  Yes    Education Details  Hip ER release in supine, clam shell with improved form focusing on not compressing the lumbopelvic area.     Person(s) Educated  Patient    Methods  Explanation;Demonstration;Tactile cues;Verbal cues;Handout    Comprehension  Verbalized understanding;Returned demonstration       PT Short Term Goals - 06/01/17 0850      PT SHORT TERM GOAL #4   Title  perform [employer]  workout with 30% less knee pain    Status  Achieved        PT Long Term Goals - 07/13/17 1455      PT LONG TERM GOAL #1   Title  be independent in advanced HEP    Time  8    Period  Weeks    Status  On-going      PT LONG TERM GOAL #2   Title  reduce FOTO to < or = to 33% limitation    Baseline  38% limitation    Time  8    Period  Weeks    Status  On-going      PT LONG TERM GOAL #3   Title  return to [employer] workout without need for modifications due to knee pain    Baseline  not able to squat or jump    Time  8    Period  Weeks    Status  On-going      PT LONG TERM GOAL #4   Title  ride in the car/drive with 72%70% reduction in  bil knee pain    Baseline  60% improvement, worse with long drive    Time  8    Period  Weeks    Status  On-going      PT LONG TERM GOAL #5   Title  descend steps without pain and desmontrate eccentric control with step-over-step gait    Baseline  reduced pain, still with step-to gait    Time  8    Period  Weeks    Status  On-going      PT LONG TERM GOAL #6   Title  report < or = to 4/10 max knee pain with activity    Baseline  up to 6/10 with exercise, 4/10 max with regular activity    Time  8    Period  Weeks    Status  On-going            Plan - 08/26/17 1234    Clinical Impression Statement  Pt reports today her knees have been doing pretty good since last time she was here.  She thinks some of this is due to her new job and not working out so much. She does report her LT hip has been most bothersome as of late, mainly at night that keeps her from sleeping. Pt was educated in trying pillows bt her knees to lessen compression at her lumbopelvic/hip area. She agreed to try for a week. Exercises were given to fill the holes of hip motions she is currently not doing.     Rehab Potential  Good    PT Frequency  1x / week    PT Duration  8 weeks    PT Treatment/Interventions  ADLs/Self Care Home Management;Cryotherapy;Electrical  Stimulation;Functional mobility training;Stair training;Gait training;Moist Heat;Therapeutic activities;Therapeutic exercise;Neuromuscular re-education;Patient/family education;Passive range of motion;Manual techniques;Dry needling;Taping    PT Next Visit Plan  FOTO and probable DC next session, review hip series given today for HEP.     Consulted and Agree with Plan of Care  Patient       Patient will benefit from skilled therapeutic intervention in order to improve the following deficits and impairments:  Pain, Decreased strength, Impaired flexibility, Decreased activity tolerance, Decreased endurance, Increased muscle spasms, Decreased range of motion  Visit Diagnosis: Acute pain of left knee  Acute pain of right knee  Stiffness of left hip, not elsewhere classified  Stiffness of right hip, not elsewhere classified  Muscle weakness (generalized)     Problem List Patient Active Problem List   Diagnosis Date Noted  . 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 [place], [place], [address] Phone: [phone]   Fax:  [phone]  Name: [name] MRN: [id number] Date of Birth: 1976-07-08

## 2017-09-02 ENCOUNTER — Ambulatory Visit: Payer: No Typology Code available for payment source

## 2017-09-02 DIAGNOSIS — M25562 Pain in left knee: Secondary | ICD-10-CM

## 2017-09-02 DIAGNOSIS — M25651 Stiffness of right hip, not elsewhere classified: Secondary | ICD-10-CM

## 2017-09-02 DIAGNOSIS — M25652 Stiffness of left hip, not elsewhere classified: Secondary | ICD-10-CM

## 2017-09-02 DIAGNOSIS — M6281 Muscle weakness (generalized): Secondary | ICD-10-CM

## 2017-09-02 DIAGNOSIS — M25561 Pain in right knee: Secondary | ICD-10-CM

## 2017-09-02 NOTE — Therapy (Signed)
[facility] [address], [place], [place], [address] Phone: [phone]   Fax:  [phone]  Physical Therapy Treatment  Patient Details  Name: [name] MRN: [id number] Date of Birth: [birth year] Referring Provider: Rhina Brackett, MD   Encounter Date: 09/02/2017  PT End of Session - 09/02/17 1307    Visit Number  16    PT Start Time  6226    PT Stop Time  1303    PT Time Calculation (min)  28 min    Activity Tolerance  Patient tolerated treatment well    Behavior During Therapy  [facility] for tasks assessed/performed       History reviewed. No pertinent past medical history.  Past Surgical History:  Procedure Laterality Date  . CHOLECYSTECTOMY    . LAPAROSCOPIC CHOLECYSTECTOMY  2010    There were no vitals filed for this visit.  Subjective Assessment - 09/02/17 1243    Subjective  I have been feeling better after doing the stretches that I got last time.    Currently in Pain?  Yes    Pain Score  4     Pain Location  Knee    Pain Orientation  Right;Left    Pain Descriptors / Indicators  Discomfort    Pain Type  Acute pain    Pain Onset  More than a month ago    Pain Frequency  Constant    Aggravating Factors   when I exercise a lot, steps    Pain Relieving Factors  ice, stretching                      [facility] Adult PT Treatment/Exercise - 09/02/17 0001      Knee/Hip Exercises: Stretches   Other Knee/Hip Stretches  ER stretch in supine      Knee/Hip Exercises: Aerobic   Elliptical  Level 4, ramp 3 x 6 minutes PT present to discuss progress      Knee/Hip Exercises: Supine   Straight Leg Raises  Strengthening;Both;2 sets;10 reps      Knee/Hip Exercises: Prone   Hip Extension  Strengthening;Both;2 sets;10 reps               PT Short Term Goals - 06/01/17 0850      PT SHORT TERM GOAL #4   Title  perform [employer] workout with 30% less knee pain    Status  Achieved         PT Long Term Goals - 09/02/17 1237      PT LONG TERM GOAL #1   Title  be independent in advanced HEP    Status  Achieved      PT LONG TERM GOAL #2   Title  reduce FOTO to < or = to 33% limitation    Baseline  30% limitation    Status  Achieved      PT LONG TERM GOAL #3   Title  return to [employer] workout without need for modifications due to knee pain    Baseline  still no jumping- otherwise no modifications needed    Status  Partially Met      PT LONG TERM GOAL #4   Title  ride in the car/drive with 33% reduction in bil knee pain    Status  Achieved      PT LONG TERM GOAL #5   Title  descend steps without pain and desmontrate eccentric control with step-over-step gait  Baseline  not limited, able to do step-over-step with 3/10 bil knee pain    Status  Partially Met      PT LONG TERM GOAL #6   Title  report < or = to 4/10 max knee pain with activity    Baseline  up to 4/10 with regular activity    Status  Achieved            Plan - 09/02/17 1248    Clinical Impression Statement  Pt reports 65% overall improvement since the start of care.  FOTO Is improved to 30% limitation.  Pt is now exercising 4x/wk with [employer] and is only modifying the jumping actvities.  Pt reports up to 4/10 bil knee pain with descending steps.  Pt has HEP in place for conprehensive strength and flexibility for hips and knees.      PT Next Visit Plan  D/C PT to HEP today    Consulted and Agree with Plan of Care  Patient       Patient will benefit from skilled therapeutic intervention in order to improve the following deficits and impairments:     Visit Diagnosis: Acute pain of left knee  Acute pain of right knee  Stiffness of left hip, not elsewhere classified  Stiffness of right hip, not elsewhere classified  Muscle weakness (generalized)     Problem List Patient Active Problem List   Diagnosis Date Noted  . 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  Pt has HEP in place and will continue with HEP and gym exercises for continued gains.   Remaining deficits: Pt with knee pain with steps and squats.  Pt not able to jump with her [facility] workout.   Education / Equipment: HEP Plan: Patient agrees to discharge.  Patient goals were partially met. Patient is being discharged due to being pleased with the current functional level.  ?????         Sigurd Sos, PT 09/02/17 1:09 PM  [facility] [address], [place], [place], [address] Phone: [phone]   Fax:  [phone]  Name: [name] MRN: [id number] Date of Birth: [birth year]

## 2017-09-09 ENCOUNTER — Encounter: Payer: BLUE CROSS/BLUE SHIELD | Admitting: Physical Therapy

## 2018-06-11 ENCOUNTER — Encounter (HOSPITAL_BASED_OUTPATIENT_CLINIC_OR_DEPARTMENT_OTHER): Payer: Self-pay | Admitting: Emergency Medicine

## 2018-06-11 ENCOUNTER — Emergency Department (HOSPITAL_BASED_OUTPATIENT_CLINIC_OR_DEPARTMENT_OTHER)
Admission: EM | Admit: 2018-06-11 | Discharge: 2018-06-11 | Disposition: A | Discharge status: Home / Self Care | Payer: No Typology Code available for payment source | Attending: Emergency Medicine | Admitting: Emergency Medicine

## 2018-06-11 ENCOUNTER — Emergency Department (HOSPITAL_BASED_OUTPATIENT_CLINIC_OR_DEPARTMENT_OTHER): Payer: No Typology Code available for payment source

## 2018-06-11 ENCOUNTER — Other Ambulatory Visit: Payer: Self-pay

## 2018-06-11 DIAGNOSIS — Z79899 Other long term (current) drug therapy: Secondary | ICD-10-CM | POA: Diagnosis not present

## 2018-06-11 DIAGNOSIS — S199XXA Unspecified injury of neck, initial encounter: Secondary | ICD-10-CM | POA: Insufficient documentation

## 2018-06-11 DIAGNOSIS — Y998 Other external cause status: Secondary | ICD-10-CM | POA: Insufficient documentation

## 2018-06-11 DIAGNOSIS — Y92410 Unspecified street and highway as the place of occurrence of the external cause: Secondary | ICD-10-CM | POA: Insufficient documentation

## 2018-06-11 DIAGNOSIS — S20312A Abrasion of left front wall of thorax, initial encounter: Secondary | ICD-10-CM | POA: Diagnosis not present

## 2018-06-11 DIAGNOSIS — Y93I9 Activity, other involving external motion: Secondary | ICD-10-CM | POA: Diagnosis not present

## 2018-06-11 DIAGNOSIS — R0789 Other chest pain: Secondary | ICD-10-CM

## 2018-06-11 DIAGNOSIS — J3489 Other specified disorders of nose and nasal sinuses: Secondary | ICD-10-CM

## 2018-06-11 DIAGNOSIS — S301XXA Contusion of abdominal wall, initial encounter: Secondary | ICD-10-CM | POA: Insufficient documentation

## 2018-06-11 DIAGNOSIS — T148XXA Other injury of unspecified body region, initial encounter: Secondary | ICD-10-CM

## 2018-06-11 DIAGNOSIS — S299XXA Unspecified injury of thorax, initial encounter: Secondary | ICD-10-CM | POA: Insufficient documentation

## 2018-06-11 DIAGNOSIS — S0992XA Unspecified injury of nose, initial encounter: Secondary | ICD-10-CM | POA: Diagnosis present

## 2018-06-11 DIAGNOSIS — M542 Cervicalgia: Secondary | ICD-10-CM

## 2018-06-11 LAB — COMPREHENSIVE METABOLIC PANEL
ALT: 22 U/L (ref 0–44)
ANION GAP: 9 (ref 5–15)
AST: 23 U/L (ref 15–41)
Albumin: 4.2 g/dL (ref 3.5–5.0)
Alkaline Phosphatase: 40 U/L (ref 38–126)
BUN: 26 mg/dL — ABNORMAL HIGH (ref 6–20)
CALCIUM: 9.2 mg/dL (ref 8.9–10.3)
CO2: 26 mmol/L (ref 22–32)
Chloride: 100 mmol/L (ref 98–111)
Creatinine, Ser: 0.85 mg/dL (ref 0.44–1.00)
GFR calc non Af Amer: >60 mL/min (ref >60)
Glucose, Bld: 104 mg/dL — ABNORMAL HIGH (ref 70–99)
Potassium: 3.7 mmol/L (ref 3.5–5.1)
Sodium: 135 mmol/L (ref 135–145)
TOTAL PROTEIN: 7.2 g/dL (ref 6.5–8.1)
Total Bilirubin: 0.4 mg/dL (ref 0.3–1.2)

## 2018-06-11 LAB — CBC
HCT: 39.4 % (ref 36.0–46.0)
Hemoglobin: 12.6 g/dL (ref 12.0–15.0)
MCH: 28.4 pg (ref 26.0–34.0)
MCHC: 32 g/dL (ref 30.0–36.0)
MCV: 88.9 fL (ref 80.0–100.0)
Platelets: 253 10*3/uL (ref 150–400)
RBC: 4.43 MIL/uL (ref 3.87–5.11)
RDW: 11.8 % (ref 11.5–15.5)
WBC: 8.9 10*3/uL (ref 4.0–10.5)
nRBC: 0 % (ref 0.0–0.2)

## 2018-06-11 LAB — URINALYSIS, ROUTINE W REFLEX MICROSCOPIC
BILIRUBIN URINE: NEGATIVE
Glucose, UA: NEGATIVE mg/dL
Hgb urine dipstick: NEGATIVE
KETONES UR: NEGATIVE mg/dL
Leukocytes, UA: NEGATIVE
Nitrite: NEGATIVE
PH: 6 (ref 5.0–8.0)
Protein, ur: NEGATIVE mg/dL
Specific Gravity, Urine: 1.01 (ref 1.005–1.030)

## 2018-06-11 LAB — PREGNANCY, URINE: PREG TEST UR: NEGATIVE

## 2018-06-11 MED ORDER — METHOCARBAMOL 750 MG PO TABS: 750.0000 mg | ORAL_TABLET | Freq: Three times a day (TID) | ORAL | 0 refills | Status: AC | PRN

## 2018-06-11 MED ORDER — IOPAMIDOL (ISOVUE-300) INJECTION 61%
100.0000 mL | Freq: Once | INTRAVENOUS | Status: AC
  Administered 2018-06-11: 100 mL via INTRAVENOUS

## 2018-06-11 MED ORDER — MORPHINE SULFATE (PF) 2 MG/ML IV SOLN
2.0000 mg | INTRAVENOUS | Status: DC | PRN
  Administered 2018-06-11: 2 mg via INTRAVENOUS
  Filled 2018-06-11: qty 1

## 2018-06-11 MED ORDER — CYCLOBENZAPRINE HCL 10 MG PO TABS
10.0000 mg | ORAL_TABLET | Freq: Once | ORAL | Status: AC
  Administered 2018-06-11: 10 mg via ORAL
  Filled 2018-06-11: qty 1

## 2018-06-11 NOTE — ED Notes (Signed)
Patient transported to CT 
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
## 2018-06-11 NOTE — ED Notes (Signed)
ED Provider at bedside. 
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
## 2018-06-11 NOTE — Discharge Instructions (Addendum)
Today your CAT scans of your neck, chest, abdomen, pelvis did not show any fractures.  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  Your blood work was reassuring today.  Your chest x-ray did not show any abnormalities, and your x-rays of your nose did not show evidence of a fracture.  While in the emergency room your blood pressure had 1 or more readings that was elevated.  Please follow-up with your primary care doctor in the next week for a recheck.  This is most likely from being in pain, stressed, and in the emergency room.  Please take Ibuprofen (Advil, motrin) and Tylenol (acetaminophen) to relieve your pain.  You may take up to 600 MG (3 pills) of normal strength ibuprofen every 8 hours as needed.  In between doses of ibuprofen you make take tylenol, up to 1,000 mg (two extra strength pills).  Do not take more than 3,000 mg tylenol in a 24 hour period.  Please check all medication labels as many medications such as pain and cold medications may contain tylenol.  Do not drink alcohol while taking these medications.  Do not take other NSAID'S while taking ibuprofen (such as aleve or naproxen).  Please take ibuprofen with food to decrease stomach upset.  Today you received medications that may make you sleepy or impair your ability to make decisions.  For the next 24 hours please do not drive, operate heavy machinery, care for a small child with out another adult present, or perform any activities that may cause harm to you or someone else if you were to fall asleep or be impaired.   You are being prescribed a medication which may make you sleepy. Please follow up of listed precautions for at least 24 hours after taking one dose.  The best way to get rid of muscle pain is by taking NSAIDS, using heat, massage therapy, and gentle stretching/range of motion exercises.

## 2018-09-07 ENCOUNTER — Other Ambulatory Visit: Payer: Self-pay | Admitting: Obstetrics and Gynecology

## 2018-09-07 ENCOUNTER — Other Ambulatory Visit (HOSPITAL_COMMUNITY)
Admission: RE | Admit: 2018-09-07 | Discharge: 2018-09-07 | Disposition: A | Discharge status: Home / Self Care | Payer: No Typology Code available for payment source | Source: Ambulatory Visit | Attending: Obstetrics and Gynecology | Admitting: Obstetrics and Gynecology

## 2018-09-07 DIAGNOSIS — Z01419 Encounter for gynecological examination (general) (routine) without abnormal findings: Secondary | ICD-10-CM | POA: Insufficient documentation

## 2018-09-09 LAB — CYTOLOGY - PAP
Diagnosis: NEGATIVE
HPV (WINDOPATH): NOT DETECTED

## 2019-04-26 DIAGNOSIS — F419 Anxiety disorder, unspecified: Secondary | ICD-10-CM | POA: Diagnosis not present

## 2019-04-26 DIAGNOSIS — F329 Major depressive disorder, single episode, unspecified: Secondary | ICD-10-CM | POA: Diagnosis not present

## 2019-05-10 DIAGNOSIS — F419 Anxiety disorder, unspecified: Secondary | ICD-10-CM | POA: Diagnosis not present

## 2019-05-10 DIAGNOSIS — F329 Major depressive disorder, single episode, unspecified: Secondary | ICD-10-CM | POA: Diagnosis not present

## 2019-05-24 DIAGNOSIS — F419 Anxiety disorder, unspecified: Secondary | ICD-10-CM | POA: Diagnosis not present

## 2019-06-07 DIAGNOSIS — F419 Anxiety disorder, unspecified: Secondary | ICD-10-CM | POA: Diagnosis not present

## 2019-06-21 DIAGNOSIS — F419 Anxiety disorder, unspecified: Secondary | ICD-10-CM | POA: Diagnosis not present

## 2019-07-12 DIAGNOSIS — F419 Anxiety disorder, unspecified: Secondary | ICD-10-CM | POA: Diagnosis not present

## 2019-07-12 DIAGNOSIS — F329 Major depressive disorder, single episode, unspecified: Secondary | ICD-10-CM | POA: Diagnosis not present

## 2019-07-26 DIAGNOSIS — F419 Anxiety disorder, unspecified: Secondary | ICD-10-CM | POA: Diagnosis not present

## 2019-07-26 DIAGNOSIS — F329 Major depressive disorder, single episode, unspecified: Secondary | ICD-10-CM | POA: Diagnosis not present

## 2019-08-23 DIAGNOSIS — F419 Anxiety disorder, unspecified: Secondary | ICD-10-CM | POA: Diagnosis not present

## 2019-08-23 DIAGNOSIS — F329 Major depressive disorder, single episode, unspecified: Secondary | ICD-10-CM | POA: Diagnosis not present

## 2019-09-06 DIAGNOSIS — F419 Anxiety disorder, unspecified: Secondary | ICD-10-CM | POA: Diagnosis not present

## 2019-09-06 DIAGNOSIS — F329 Major depressive disorder, single episode, unspecified: Secondary | ICD-10-CM | POA: Diagnosis not present

## 2019-09-12 DIAGNOSIS — Z113 Encounter for screening for infections with a predominantly sexual mode of transmission: Secondary | ICD-10-CM | POA: Diagnosis not present

## 2019-09-12 DIAGNOSIS — Z01419 Encounter for gynecological examination (general) (routine) without abnormal findings: Secondary | ICD-10-CM | POA: Diagnosis not present

## 2019-09-20 DIAGNOSIS — F329 Major depressive disorder, single episode, unspecified: Secondary | ICD-10-CM | POA: Diagnosis not present

## 2019-09-20 DIAGNOSIS — F419 Anxiety disorder, unspecified: Secondary | ICD-10-CM | POA: Diagnosis not present

## 2019-09-30 DIAGNOSIS — M9901 Segmental and somatic dysfunction of cervical region: Secondary | ICD-10-CM | POA: Diagnosis not present

## 2019-09-30 DIAGNOSIS — M9906 Segmental and somatic dysfunction of lower extremity: Secondary | ICD-10-CM | POA: Diagnosis not present

## 2019-09-30 DIAGNOSIS — M791 Myalgia, unspecified site: Secondary | ICD-10-CM | POA: Diagnosis not present

## 2019-09-30 DIAGNOSIS — M9902 Segmental and somatic dysfunction of thoracic region: Secondary | ICD-10-CM | POA: Diagnosis not present

## 2019-10-06 DIAGNOSIS — M791 Myalgia, unspecified site: Secondary | ICD-10-CM | POA: Diagnosis not present

## 2019-10-06 DIAGNOSIS — M9902 Segmental and somatic dysfunction of thoracic region: Secondary | ICD-10-CM | POA: Diagnosis not present

## 2019-10-06 DIAGNOSIS — M9901 Segmental and somatic dysfunction of cervical region: Secondary | ICD-10-CM | POA: Diagnosis not present

## 2019-10-06 DIAGNOSIS — M9906 Segmental and somatic dysfunction of lower extremity: Secondary | ICD-10-CM | POA: Diagnosis not present

## 2019-10-08 ENCOUNTER — Ambulatory Visit: Payer: Self-pay | Attending: Internal Medicine

## 2019-10-08 DIAGNOSIS — Z23 Encounter for immunization: Secondary | ICD-10-CM | POA: Insufficient documentation

## 2019-10-08 NOTE — Progress Notes (Signed)
   Covid-19 Vaccination Clinic  Name:  [name]    MRN: [id number] DOB: 1975/10/19  10/08/2019  [name] was observed post Covid-19 immunization for 15 minutes without incidence. She was provided with Vaccine Information Sheet and instruction to access the V-Safe system.   [name] was instructed to call 911 with any severe reactions post vaccine: Marland Kitchen Difficulty breathing  . Swelling of your face and throat  . A fast heartbeat  . A bad rash all over your body  . Dizziness and weakness    Immunizations Administered    Name Date Dose VIS Date Route   Pfizer COVID-19 Vaccine 10/08/2019 10:56 AM 0.3 mL 07/22/2019 Intramuscular   Manufacturer: [employer], [employer]   Lot: [id number]   NDC: [address]-4

## 2019-10-13 DIAGNOSIS — M9901 Segmental and somatic dysfunction of cervical region: Secondary | ICD-10-CM | POA: Diagnosis not present

## 2019-10-13 DIAGNOSIS — M9902 Segmental and somatic dysfunction of thoracic region: Secondary | ICD-10-CM | POA: Diagnosis not present

## 2019-10-13 DIAGNOSIS — M9906 Segmental and somatic dysfunction of lower extremity: Secondary | ICD-10-CM | POA: Diagnosis not present

## 2019-10-13 DIAGNOSIS — M791 Myalgia, unspecified site: Secondary | ICD-10-CM | POA: Diagnosis not present

## 2019-10-29 ENCOUNTER — Ambulatory Visit: Payer: Self-pay | Attending: Internal Medicine

## 2019-10-29 DIAGNOSIS — Z23 Encounter for immunization: Secondary | ICD-10-CM

## 2019-10-29 NOTE — Progress Notes (Signed)
   Covid-19 Vaccination Clinic  Name:  [name]    MRN: [id number] DOB: 1976-02-13  10/29/2019  [name] was observed post Covid-19 immunization for 15 minutes without incident. She was provided with Vaccine Information Sheet and instruction to access the V-Safe system.   [name] was instructed to call 911 with any severe reactions post vaccine: Marland Kitchen Difficulty breathing  . Swelling of face and throat  . A fast heartbeat  . A bad rash all over body  . Dizziness and weakness   Immunizations Administered    Name Date Dose VIS Date Route   Pfizer COVID-19 Vaccine 10/29/2019 12:03 PM 0.3 mL 07/22/2019 Intramuscular   Manufacturer: [employer], [employer]   Lot: [id number]   NDC: [address]-4

## 2019-11-08 ENCOUNTER — Ambulatory Visit: Payer: Self-pay

## 2020-02-29 ENCOUNTER — Other Ambulatory Visit: Payer: Self-pay | Admitting: Obstetrics and Gynecology

## 2020-02-29 DIAGNOSIS — M791 Myalgia, unspecified site: Secondary | ICD-10-CM | POA: Diagnosis not present

## 2020-02-29 DIAGNOSIS — M9906 Segmental and somatic dysfunction of lower extremity: Secondary | ICD-10-CM | POA: Diagnosis not present

## 2020-02-29 DIAGNOSIS — M9901 Segmental and somatic dysfunction of cervical region: Secondary | ICD-10-CM | POA: Diagnosis not present

## 2020-02-29 DIAGNOSIS — Z1231 Encounter for screening mammogram for malignant neoplasm of breast: Secondary | ICD-10-CM

## 2020-02-29 DIAGNOSIS — M9902 Segmental and somatic dysfunction of thoracic region: Secondary | ICD-10-CM | POA: Diagnosis not present

## 2020-03-08 DIAGNOSIS — M9901 Segmental and somatic dysfunction of cervical region: Secondary | ICD-10-CM | POA: Diagnosis not present

## 2020-03-08 DIAGNOSIS — M791 Myalgia, unspecified site: Secondary | ICD-10-CM | POA: Diagnosis not present

## 2020-03-08 DIAGNOSIS — M9902 Segmental and somatic dysfunction of thoracic region: Secondary | ICD-10-CM | POA: Diagnosis not present

## 2020-03-08 DIAGNOSIS — M9906 Segmental and somatic dysfunction of lower extremity: Secondary | ICD-10-CM | POA: Diagnosis not present

## 2020-03-12 ENCOUNTER — Ambulatory Visit: Payer: Self-pay

## 2020-03-16 ENCOUNTER — Ambulatory Visit: Payer: Self-pay

## 2020-03-21 ENCOUNTER — Other Ambulatory Visit: Payer: Self-pay

## 2020-03-21 ENCOUNTER — Ambulatory Visit
Admission: RE | Admit: 2020-03-21 | Discharge: 2020-03-21 | Disposition: A | Discharge status: Home / Self Care | Payer: Self-pay | Source: Ambulatory Visit | Attending: Obstetrics and Gynecology | Admitting: Obstetrics and Gynecology

## 2020-03-21 DIAGNOSIS — Z1231 Encounter for screening mammogram for malignant neoplasm of breast: Secondary | ICD-10-CM

## 2020-03-22 ENCOUNTER — Other Ambulatory Visit: Payer: Self-pay | Admitting: Obstetrics and Gynecology

## 2020-03-22 DIAGNOSIS — M9901 Segmental and somatic dysfunction of cervical region: Secondary | ICD-10-CM | POA: Diagnosis not present

## 2020-03-22 DIAGNOSIS — M9902 Segmental and somatic dysfunction of thoracic region: Secondary | ICD-10-CM | POA: Diagnosis not present

## 2020-03-22 DIAGNOSIS — M791 Myalgia, unspecified site: Secondary | ICD-10-CM | POA: Diagnosis not present

## 2020-03-22 DIAGNOSIS — M9906 Segmental and somatic dysfunction of lower extremity: Secondary | ICD-10-CM | POA: Diagnosis not present

## 2020-03-22 DIAGNOSIS — R928 Other abnormal and inconclusive findings on diagnostic imaging of breast: Secondary | ICD-10-CM

## 2020-04-05 DIAGNOSIS — M9906 Segmental and somatic dysfunction of lower extremity: Secondary | ICD-10-CM | POA: Diagnosis not present

## 2020-04-05 DIAGNOSIS — M9902 Segmental and somatic dysfunction of thoracic region: Secondary | ICD-10-CM | POA: Diagnosis not present

## 2020-04-05 DIAGNOSIS — M791 Myalgia, unspecified site: Secondary | ICD-10-CM | POA: Diagnosis not present

## 2020-04-05 DIAGNOSIS — M9901 Segmental and somatic dysfunction of cervical region: Secondary | ICD-10-CM | POA: Diagnosis not present

## 2020-04-06 ENCOUNTER — Other Ambulatory Visit: Payer: BC Managed Care – PPO

## 2020-04-19 ENCOUNTER — Ambulatory Visit
Admission: RE | Admit: 2020-04-19 | Discharge: 2020-04-19 | Disposition: A | Discharge status: Home / Self Care | Payer: BC Managed Care – PPO | Source: Ambulatory Visit | Attending: Obstetrics and Gynecology | Admitting: Obstetrics and Gynecology

## 2020-04-19 ENCOUNTER — Other Ambulatory Visit: Payer: Self-pay

## 2020-04-19 DIAGNOSIS — R928 Other abnormal and inconclusive findings on diagnostic imaging of breast: Secondary | ICD-10-CM

## 2020-04-19 DIAGNOSIS — N6002 Solitary cyst of left breast: Secondary | ICD-10-CM | POA: Diagnosis not present

## 2020-04-26 DIAGNOSIS — M9906 Segmental and somatic dysfunction of lower extremity: Secondary | ICD-10-CM | POA: Diagnosis not present

## 2020-04-26 DIAGNOSIS — M9902 Segmental and somatic dysfunction of thoracic region: Secondary | ICD-10-CM | POA: Diagnosis not present

## 2020-04-26 DIAGNOSIS — M9901 Segmental and somatic dysfunction of cervical region: Secondary | ICD-10-CM | POA: Diagnosis not present

## 2020-04-26 DIAGNOSIS — M791 Myalgia, unspecified site: Secondary | ICD-10-CM | POA: Diagnosis not present

## 2020-04-27 DIAGNOSIS — Z20822 Contact with and (suspected) exposure to covid-19: Secondary | ICD-10-CM | POA: Diagnosis not present

## 2020-05-02 DIAGNOSIS — F418 Other specified anxiety disorders: Secondary | ICD-10-CM | POA: Diagnosis not present

## 2020-05-02 DIAGNOSIS — L309 Dermatitis, unspecified: Secondary | ICD-10-CM | POA: Diagnosis not present

## 2020-05-02 DIAGNOSIS — R21 Rash and other nonspecific skin eruption: Secondary | ICD-10-CM | POA: Diagnosis not present

## 2020-05-09 DIAGNOSIS — R21 Rash and other nonspecific skin eruption: Secondary | ICD-10-CM | POA: Diagnosis not present

## 2020-05-09 DIAGNOSIS — J309 Allergic rhinitis, unspecified: Secondary | ICD-10-CM | POA: Diagnosis not present

## 2020-05-09 DIAGNOSIS — H1045 Other chronic allergic conjunctivitis: Secondary | ICD-10-CM | POA: Diagnosis not present

## 2020-05-09 DIAGNOSIS — J301 Allergic rhinitis due to pollen: Secondary | ICD-10-CM | POA: Diagnosis not present

## 2020-05-10 DIAGNOSIS — M9906 Segmental and somatic dysfunction of lower extremity: Secondary | ICD-10-CM | POA: Diagnosis not present

## 2020-05-10 DIAGNOSIS — M791 Myalgia, unspecified site: Secondary | ICD-10-CM | POA: Diagnosis not present

## 2020-05-10 DIAGNOSIS — M9901 Segmental and somatic dysfunction of cervical region: Secondary | ICD-10-CM | POA: Diagnosis not present

## 2020-05-10 DIAGNOSIS — M9902 Segmental and somatic dysfunction of thoracic region: Secondary | ICD-10-CM | POA: Diagnosis not present

## 2020-05-24 DIAGNOSIS — M791 Myalgia, unspecified site: Secondary | ICD-10-CM | POA: Diagnosis not present

## 2020-05-24 DIAGNOSIS — M9901 Segmental and somatic dysfunction of cervical region: Secondary | ICD-10-CM | POA: Diagnosis not present

## 2020-05-24 DIAGNOSIS — M9902 Segmental and somatic dysfunction of thoracic region: Secondary | ICD-10-CM | POA: Diagnosis not present

## 2020-05-24 DIAGNOSIS — M9906 Segmental and somatic dysfunction of lower extremity: Secondary | ICD-10-CM | POA: Diagnosis not present

## 2020-05-30 DIAGNOSIS — L2084 Intrinsic (allergic) eczema: Secondary | ICD-10-CM | POA: Diagnosis not present

## 2020-05-30 DIAGNOSIS — L300 Nummular dermatitis: Secondary | ICD-10-CM | POA: Diagnosis not present

## 2020-06-14 DIAGNOSIS — M9902 Segmental and somatic dysfunction of thoracic region: Secondary | ICD-10-CM | POA: Diagnosis not present

## 2020-06-14 DIAGNOSIS — M9906 Segmental and somatic dysfunction of lower extremity: Secondary | ICD-10-CM | POA: Diagnosis not present

## 2020-06-14 DIAGNOSIS — M791 Myalgia, unspecified site: Secondary | ICD-10-CM | POA: Diagnosis not present

## 2020-06-14 DIAGNOSIS — M9901 Segmental and somatic dysfunction of cervical region: Secondary | ICD-10-CM | POA: Diagnosis not present

## 2020-06-22 DIAGNOSIS — F4323 Adjustment disorder with mixed anxiety and depressed mood: Secondary | ICD-10-CM | POA: Diagnosis not present

## 2020-06-28 DIAGNOSIS — M9901 Segmental and somatic dysfunction of cervical region: Secondary | ICD-10-CM | POA: Diagnosis not present

## 2020-06-28 DIAGNOSIS — M9906 Segmental and somatic dysfunction of lower extremity: Secondary | ICD-10-CM | POA: Diagnosis not present

## 2020-06-28 DIAGNOSIS — M9902 Segmental and somatic dysfunction of thoracic region: Secondary | ICD-10-CM | POA: Diagnosis not present

## 2020-06-28 DIAGNOSIS — M791 Myalgia, unspecified site: Secondary | ICD-10-CM | POA: Diagnosis not present

## 2020-06-30 DIAGNOSIS — Z20822 Contact with and (suspected) exposure to covid-19: Secondary | ICD-10-CM | POA: Diagnosis not present

## 2020-07-02 DIAGNOSIS — Z20822 Contact with and (suspected) exposure to covid-19: Secondary | ICD-10-CM | POA: Diagnosis not present

## 2020-07-09 DIAGNOSIS — L235 Allergic contact dermatitis due to other chemical products: Secondary | ICD-10-CM | POA: Diagnosis not present

## 2020-07-12 DIAGNOSIS — L249 Irritant contact dermatitis, unspecified cause: Secondary | ICD-10-CM | POA: Diagnosis not present

## 2020-07-12 DIAGNOSIS — L298 Other pruritus: Secondary | ICD-10-CM | POA: Diagnosis not present

## 2020-07-18 DIAGNOSIS — F4323 Adjustment disorder with mixed anxiety and depressed mood: Secondary | ICD-10-CM | POA: Diagnosis not present

## 2020-07-19 DIAGNOSIS — M9906 Segmental and somatic dysfunction of lower extremity: Secondary | ICD-10-CM | POA: Diagnosis not present

## 2020-07-19 DIAGNOSIS — M9902 Segmental and somatic dysfunction of thoracic region: Secondary | ICD-10-CM | POA: Diagnosis not present

## 2020-07-19 DIAGNOSIS — M9901 Segmental and somatic dysfunction of cervical region: Secondary | ICD-10-CM | POA: Diagnosis not present

## 2020-07-19 DIAGNOSIS — M791 Myalgia, unspecified site: Secondary | ICD-10-CM | POA: Diagnosis not present

## 2020-07-27 DIAGNOSIS — Z20822 Contact with and (suspected) exposure to covid-19: Secondary | ICD-10-CM | POA: Diagnosis not present

## 2020-08-08 DIAGNOSIS — F4323 Adjustment disorder with mixed anxiety and depressed mood: Secondary | ICD-10-CM | POA: Diagnosis not present

## 2020-08-09 DIAGNOSIS — M9901 Segmental and somatic dysfunction of cervical region: Secondary | ICD-10-CM | POA: Diagnosis not present

## 2020-08-09 DIAGNOSIS — M9902 Segmental and somatic dysfunction of thoracic region: Secondary | ICD-10-CM | POA: Diagnosis not present

## 2020-08-09 DIAGNOSIS — M9906 Segmental and somatic dysfunction of lower extremity: Secondary | ICD-10-CM | POA: Diagnosis not present

## 2020-08-09 DIAGNOSIS — M791 Myalgia, unspecified site: Secondary | ICD-10-CM | POA: Diagnosis not present

## 2020-08-18 DIAGNOSIS — Z1152 Encounter for screening for COVID-19: Secondary | ICD-10-CM | POA: Diagnosis not present

## 2020-08-22 DIAGNOSIS — M9901 Segmental and somatic dysfunction of cervical region: Secondary | ICD-10-CM | POA: Diagnosis not present

## 2020-08-22 DIAGNOSIS — M9902 Segmental and somatic dysfunction of thoracic region: Secondary | ICD-10-CM | POA: Diagnosis not present

## 2020-08-22 DIAGNOSIS — M791 Myalgia, unspecified site: Secondary | ICD-10-CM | POA: Diagnosis not present

## 2020-08-22 DIAGNOSIS — M9906 Segmental and somatic dysfunction of lower extremity: Secondary | ICD-10-CM | POA: Diagnosis not present

## 2020-08-30 DIAGNOSIS — F4323 Adjustment disorder with mixed anxiety and depressed mood: Secondary | ICD-10-CM | POA: Diagnosis not present

## 2020-09-05 DIAGNOSIS — M9902 Segmental and somatic dysfunction of thoracic region: Secondary | ICD-10-CM | POA: Diagnosis not present

## 2020-09-05 DIAGNOSIS — M9901 Segmental and somatic dysfunction of cervical region: Secondary | ICD-10-CM | POA: Diagnosis not present

## 2020-09-05 DIAGNOSIS — M791 Myalgia, unspecified site: Secondary | ICD-10-CM | POA: Diagnosis not present

## 2020-09-05 DIAGNOSIS — M9906 Segmental and somatic dysfunction of lower extremity: Secondary | ICD-10-CM | POA: Diagnosis not present

## 2020-09-12 ENCOUNTER — Other Ambulatory Visit: Payer: Self-pay | Admitting: Obstetrics and Gynecology

## 2020-09-12 DIAGNOSIS — Z01419 Encounter for gynecological examination (general) (routine) without abnormal findings: Secondary | ICD-10-CM | POA: Diagnosis not present

## 2020-09-12 DIAGNOSIS — N898 Other specified noninflammatory disorders of vagina: Secondary | ICD-10-CM | POA: Diagnosis not present

## 2020-09-12 DIAGNOSIS — Z113 Encounter for screening for infections with a predominantly sexual mode of transmission: Secondary | ICD-10-CM | POA: Diagnosis not present

## 2020-09-12 DIAGNOSIS — N841 Polyp of cervix uteri: Secondary | ICD-10-CM | POA: Diagnosis not present

## 2020-09-13 DIAGNOSIS — L300 Nummular dermatitis: Secondary | ICD-10-CM | POA: Diagnosis not present

## 2020-09-13 DIAGNOSIS — L298 Other pruritus: Secondary | ICD-10-CM | POA: Diagnosis not present

## 2020-09-20 DIAGNOSIS — M791 Myalgia, unspecified site: Secondary | ICD-10-CM | POA: Diagnosis not present

## 2020-09-20 DIAGNOSIS — M9901 Segmental and somatic dysfunction of cervical region: Secondary | ICD-10-CM | POA: Diagnosis not present

## 2020-09-20 DIAGNOSIS — M9902 Segmental and somatic dysfunction of thoracic region: Secondary | ICD-10-CM | POA: Diagnosis not present

## 2020-09-20 DIAGNOSIS — M9906 Segmental and somatic dysfunction of lower extremity: Secondary | ICD-10-CM | POA: Diagnosis not present

## 2020-09-27 DIAGNOSIS — F4323 Adjustment disorder with mixed anxiety and depressed mood: Secondary | ICD-10-CM | POA: Diagnosis not present

## 2020-10-04 DIAGNOSIS — M9906 Segmental and somatic dysfunction of lower extremity: Secondary | ICD-10-CM | POA: Diagnosis not present

## 2020-10-04 DIAGNOSIS — M9901 Segmental and somatic dysfunction of cervical region: Secondary | ICD-10-CM | POA: Diagnosis not present

## 2020-10-04 DIAGNOSIS — M791 Myalgia, unspecified site: Secondary | ICD-10-CM | POA: Diagnosis not present

## 2020-10-04 DIAGNOSIS — M9902 Segmental and somatic dysfunction of thoracic region: Secondary | ICD-10-CM | POA: Diagnosis not present

## 2020-10-17 DIAGNOSIS — F4323 Adjustment disorder with mixed anxiety and depressed mood: Secondary | ICD-10-CM | POA: Diagnosis not present

## 2020-10-18 DIAGNOSIS — M9906 Segmental and somatic dysfunction of lower extremity: Secondary | ICD-10-CM | POA: Diagnosis not present

## 2020-10-18 DIAGNOSIS — M9902 Segmental and somatic dysfunction of thoracic region: Secondary | ICD-10-CM | POA: Diagnosis not present

## 2020-10-18 DIAGNOSIS — M791 Myalgia, unspecified site: Secondary | ICD-10-CM | POA: Diagnosis not present

## 2020-10-18 DIAGNOSIS — M9901 Segmental and somatic dysfunction of cervical region: Secondary | ICD-10-CM | POA: Diagnosis not present

## 2020-11-08 DIAGNOSIS — M791 Myalgia, unspecified site: Secondary | ICD-10-CM | POA: Diagnosis not present

## 2020-11-08 DIAGNOSIS — M9901 Segmental and somatic dysfunction of cervical region: Secondary | ICD-10-CM | POA: Diagnosis not present

## 2020-11-08 DIAGNOSIS — M9906 Segmental and somatic dysfunction of lower extremity: Secondary | ICD-10-CM | POA: Diagnosis not present

## 2020-11-08 DIAGNOSIS — M9902 Segmental and somatic dysfunction of thoracic region: Secondary | ICD-10-CM | POA: Diagnosis not present

## 2020-11-13 DIAGNOSIS — L0390 Cellulitis, unspecified: Secondary | ICD-10-CM | POA: Diagnosis not present

## 2020-11-13 DIAGNOSIS — R21 Rash and other nonspecific skin eruption: Secondary | ICD-10-CM | POA: Diagnosis not present

## 2020-11-23 DIAGNOSIS — F4323 Adjustment disorder with mixed anxiety and depressed mood: Secondary | ICD-10-CM | POA: Diagnosis not present

## 2020-12-04 DIAGNOSIS — R21 Rash and other nonspecific skin eruption: Secondary | ICD-10-CM | POA: Diagnosis not present

## 2020-12-04 DIAGNOSIS — H1045 Other chronic allergic conjunctivitis: Secondary | ICD-10-CM | POA: Diagnosis not present

## 2020-12-04 DIAGNOSIS — J301 Allergic rhinitis due to pollen: Secondary | ICD-10-CM | POA: Diagnosis not present

## 2020-12-06 DIAGNOSIS — M9901 Segmental and somatic dysfunction of cervical region: Secondary | ICD-10-CM | POA: Diagnosis not present

## 2020-12-06 DIAGNOSIS — M9906 Segmental and somatic dysfunction of lower extremity: Secondary | ICD-10-CM | POA: Diagnosis not present

## 2020-12-06 DIAGNOSIS — M9902 Segmental and somatic dysfunction of thoracic region: Secondary | ICD-10-CM | POA: Diagnosis not present

## 2020-12-06 DIAGNOSIS — M791 Myalgia, unspecified site: Secondary | ICD-10-CM | POA: Diagnosis not present

## 2020-12-20 DIAGNOSIS — F4323 Adjustment disorder with mixed anxiety and depressed mood: Secondary | ICD-10-CM | POA: Diagnosis not present

## 2021-01-04 DIAGNOSIS — Z13220 Encounter for screening for lipoid disorders: Secondary | ICD-10-CM | POA: Diagnosis not present

## 2021-01-04 DIAGNOSIS — R5383 Other fatigue: Secondary | ICD-10-CM | POA: Diagnosis not present

## 2021-01-04 DIAGNOSIS — E559 Vitamin D deficiency, unspecified: Secondary | ICD-10-CM | POA: Diagnosis not present

## 2021-01-04 DIAGNOSIS — Z0000 Encounter for general adult medical examination without abnormal findings: Secondary | ICD-10-CM | POA: Diagnosis not present

## 2021-01-04 DIAGNOSIS — F418 Other specified anxiety disorders: Secondary | ICD-10-CM | POA: Diagnosis not present

## 2021-01-09 DIAGNOSIS — M9906 Segmental and somatic dysfunction of lower extremity: Secondary | ICD-10-CM | POA: Diagnosis not present

## 2021-01-09 DIAGNOSIS — M791 Myalgia, unspecified site: Secondary | ICD-10-CM | POA: Diagnosis not present

## 2021-01-09 DIAGNOSIS — M9902 Segmental and somatic dysfunction of thoracic region: Secondary | ICD-10-CM | POA: Diagnosis not present

## 2021-01-09 DIAGNOSIS — M9901 Segmental and somatic dysfunction of cervical region: Secondary | ICD-10-CM | POA: Diagnosis not present

## 2021-01-10 DIAGNOSIS — F411 Generalized anxiety disorder: Secondary | ICD-10-CM | POA: Diagnosis not present

## 2021-01-16 IMAGING — MG MM DIGITAL DIAGNOSTIC UNILAT*L* W/ TOMO W/ CAD
4 series · 4 of 12 positions shown · non-contrast
Comparison: Baseline mammogram 03/21/2020.

CLINICAL DATA: Recall from baseline screening mammography with
tomosynthesis, possible focal asymmetry involving the UPPER INNER
QUADRANT of the LEFT breast MIDDLE depth.

EXAM:
DIGITAL DIAGNOSTIC LEFT MAMMOGRAM WITH TOMO
ULTRASOUND LEFT BREAST

[L CC synth-2D]
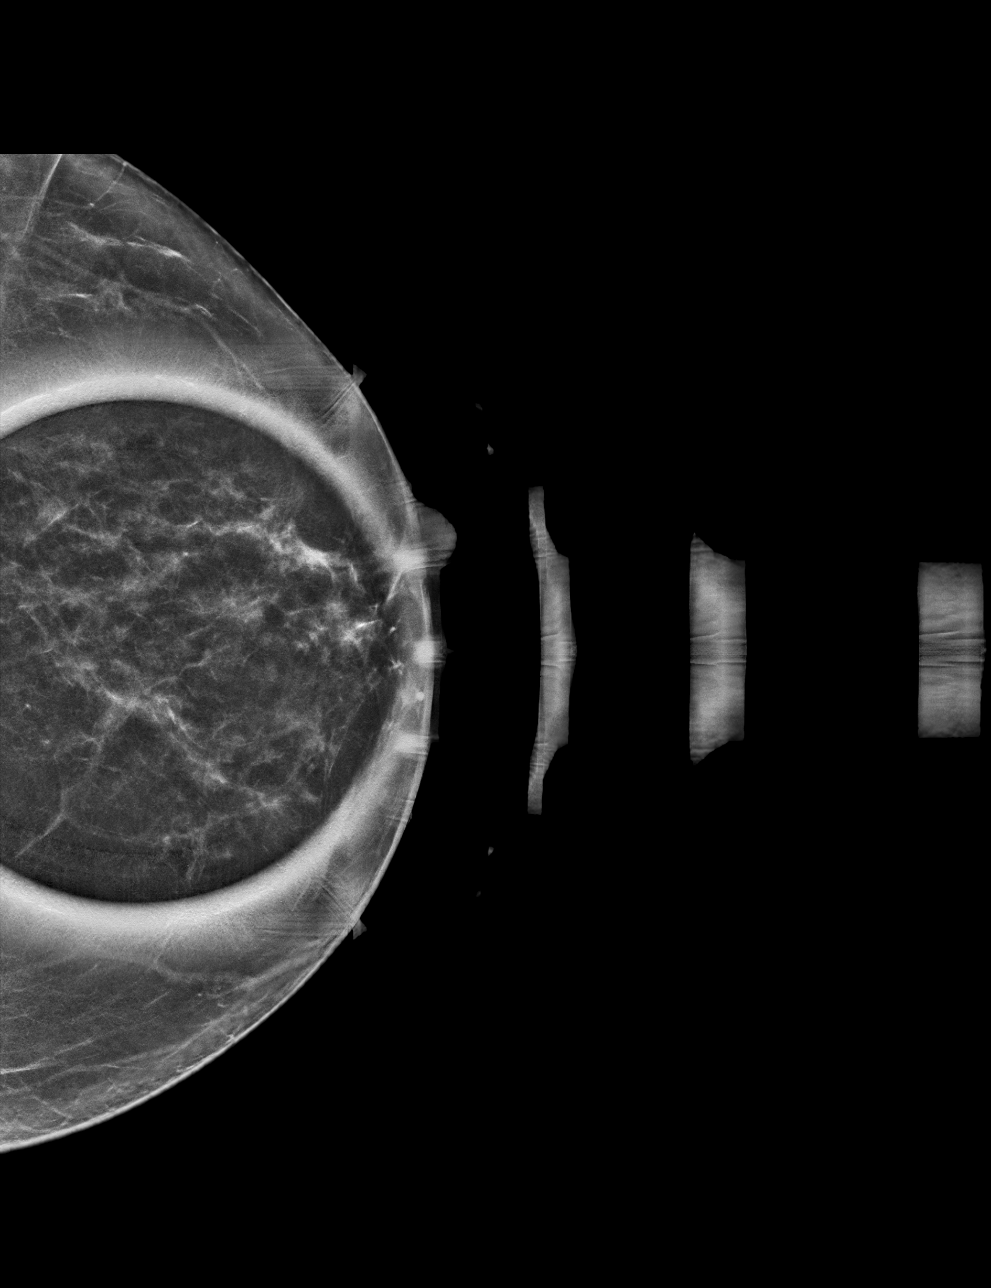

[L MLO synth-2D]
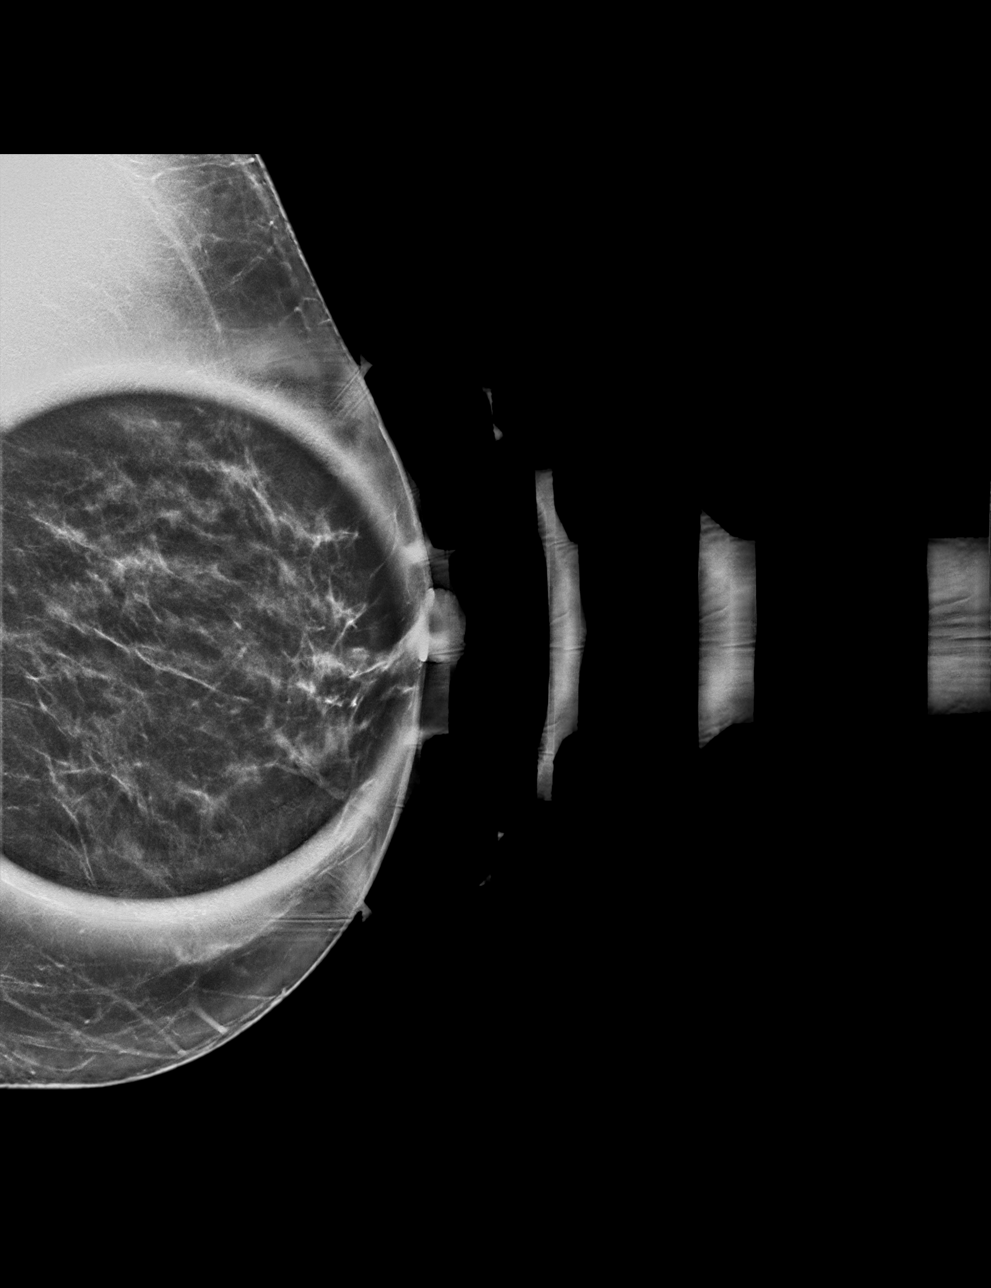

[L CC tomo · tomo slice 29/57.0]
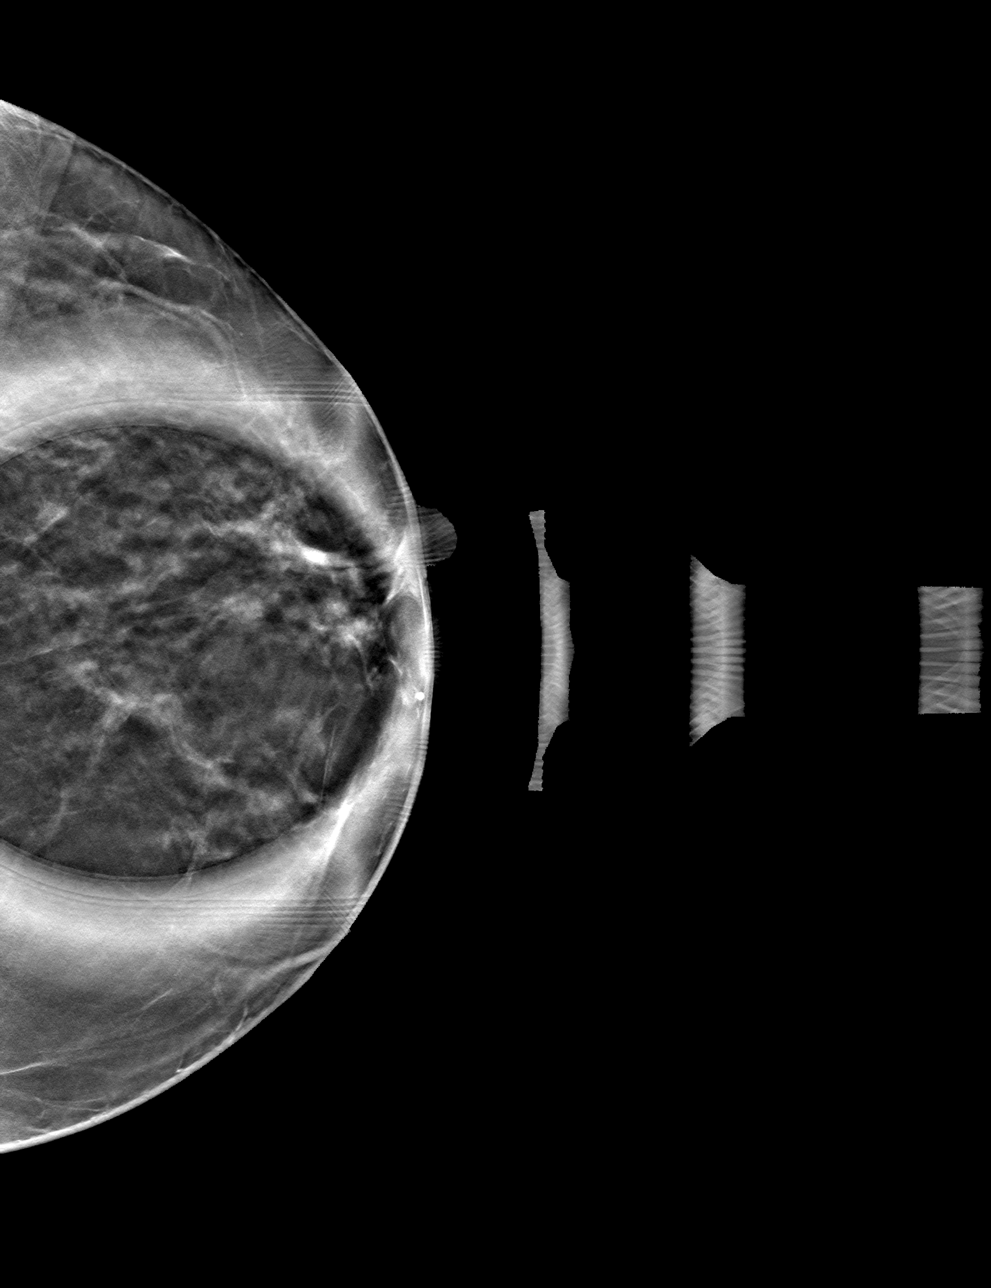

[L MLO tomo · tomo slice 31/60.0]
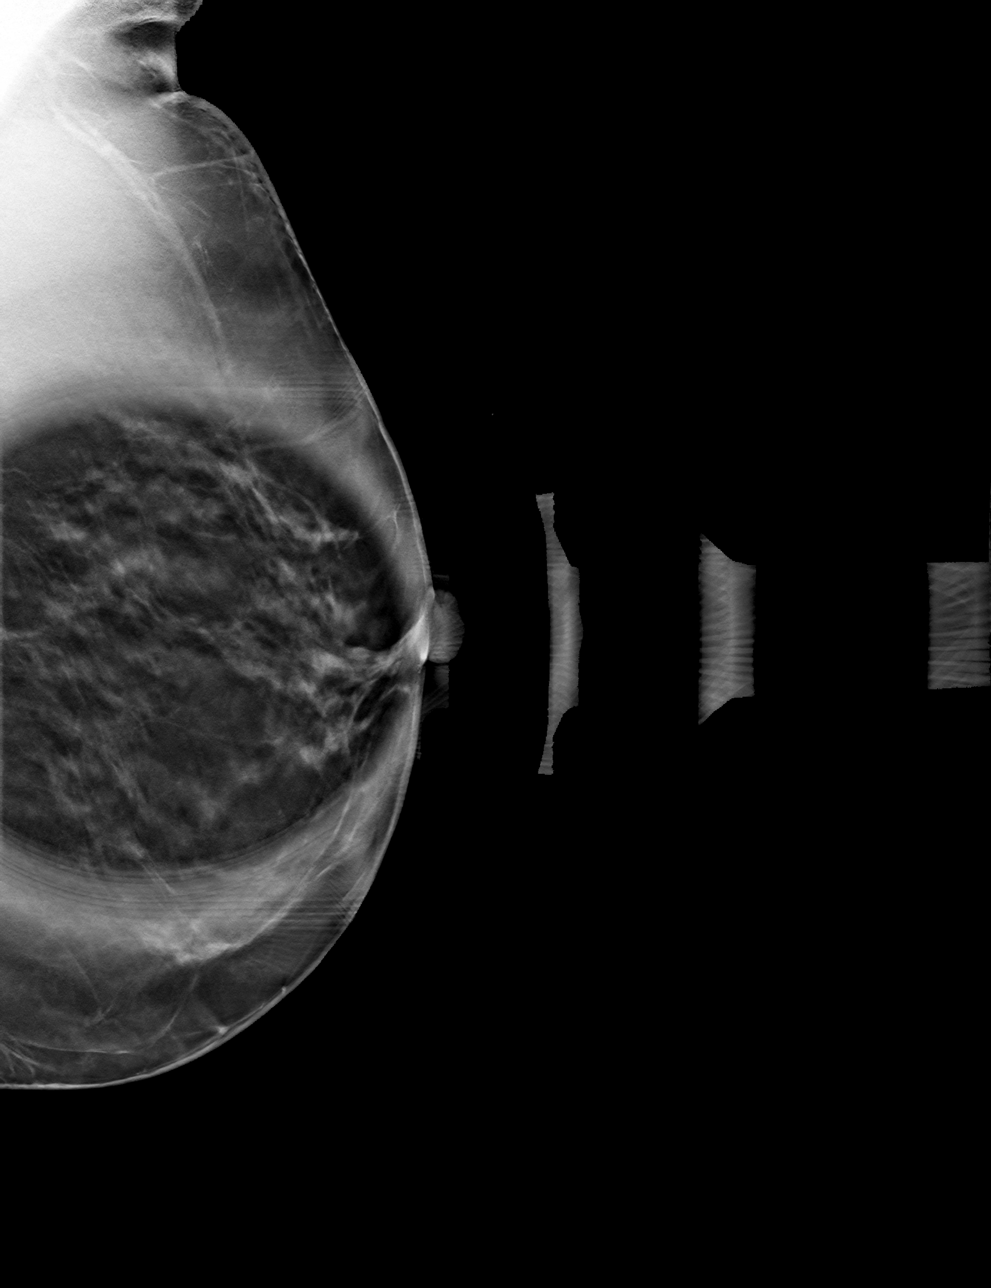

[4 of 12 positions shown; findings below may reference images not displayed]

No prior ultrasound.

ACR Breast Density Category b: There are scattered areas of
fibroglandular density.
FINDINGS: Tomosynthesis and synthesized spot-compression CC and MLO views of
the area of concern in the LEFT breast were obtained.

The focal asymmetry questioned in the UPPER INNER QUADRANT MIDDLE
depth disperses with compression, indicating overlapping
fibroglandular tissue. There is no underlying mass or architectural
distortion.

Due to the focally dense fibroglandular tissue this location on
mammography, targeted LEFT breast ultrasound is performed, showing
normal fibroglandular tissue throughout the UPPER INNER QUADRANT. A
benign simple cyst is identified at the 11 o'clock position
approximately 3 cm from the nipple measuring approximately 3 x 1 x 3
mm. An elongated simple cyst is present at this location as well. No
new suspicious solid mass or abnormal acoustic shadowing is
identified.
IMPRESSION: No mammographic or sonographic evidence of malignancy involving the
LEFT breast.

RECOMMENDATION:
Screening mammogram in one year.(Code:[id number])

I have discussed the findings and recommendations with the patient.
If applicable, a reminder letter will be sent to the patient
regarding the next appointment.

BI-RADS CATEGORY  2: Benign.

## 2021-01-30 DIAGNOSIS — F411 Generalized anxiety disorder: Secondary | ICD-10-CM | POA: Diagnosis not present

## 2021-01-31 DIAGNOSIS — M9902 Segmental and somatic dysfunction of thoracic region: Secondary | ICD-10-CM | POA: Diagnosis not present

## 2021-01-31 DIAGNOSIS — M7541 Impingement syndrome of right shoulder: Secondary | ICD-10-CM | POA: Diagnosis not present

## 2021-01-31 DIAGNOSIS — M9901 Segmental and somatic dysfunction of cervical region: Secondary | ICD-10-CM | POA: Diagnosis not present

## 2021-01-31 DIAGNOSIS — M9906 Segmental and somatic dysfunction of lower extremity: Secondary | ICD-10-CM | POA: Diagnosis not present

## 2021-02-14 DIAGNOSIS — M9906 Segmental and somatic dysfunction of lower extremity: Secondary | ICD-10-CM | POA: Diagnosis not present

## 2021-02-14 DIAGNOSIS — M9902 Segmental and somatic dysfunction of thoracic region: Secondary | ICD-10-CM | POA: Diagnosis not present

## 2021-02-14 DIAGNOSIS — M7541 Impingement syndrome of right shoulder: Secondary | ICD-10-CM | POA: Diagnosis not present

## 2021-02-14 DIAGNOSIS — M9901 Segmental and somatic dysfunction of cervical region: Secondary | ICD-10-CM | POA: Diagnosis not present

## 2021-02-15 DIAGNOSIS — F411 Generalized anxiety disorder: Secondary | ICD-10-CM | POA: Diagnosis not present

## 2021-02-28 DIAGNOSIS — M9901 Segmental and somatic dysfunction of cervical region: Secondary | ICD-10-CM | POA: Diagnosis not present

## 2021-02-28 DIAGNOSIS — M9902 Segmental and somatic dysfunction of thoracic region: Secondary | ICD-10-CM | POA: Diagnosis not present

## 2021-02-28 DIAGNOSIS — M9906 Segmental and somatic dysfunction of lower extremity: Secondary | ICD-10-CM | POA: Diagnosis not present

## 2021-02-28 DIAGNOSIS — M7541 Impingement syndrome of right shoulder: Secondary | ICD-10-CM | POA: Diagnosis not present

## 2021-03-14 DIAGNOSIS — M9902 Segmental and somatic dysfunction of thoracic region: Secondary | ICD-10-CM | POA: Diagnosis not present

## 2021-03-14 DIAGNOSIS — M9906 Segmental and somatic dysfunction of lower extremity: Secondary | ICD-10-CM | POA: Diagnosis not present

## 2021-03-14 DIAGNOSIS — M7541 Impingement syndrome of right shoulder: Secondary | ICD-10-CM | POA: Diagnosis not present

## 2021-03-14 DIAGNOSIS — M9901 Segmental and somatic dysfunction of cervical region: Secondary | ICD-10-CM | POA: Diagnosis not present

## 2021-03-19 DIAGNOSIS — L300 Nummular dermatitis: Secondary | ICD-10-CM | POA: Diagnosis not present

## 2021-03-28 DIAGNOSIS — M7541 Impingement syndrome of right shoulder: Secondary | ICD-10-CM | POA: Diagnosis not present

## 2021-03-28 DIAGNOSIS — M9906 Segmental and somatic dysfunction of lower extremity: Secondary | ICD-10-CM | POA: Diagnosis not present

## 2021-03-28 DIAGNOSIS — M9901 Segmental and somatic dysfunction of cervical region: Secondary | ICD-10-CM | POA: Diagnosis not present

## 2021-03-28 DIAGNOSIS — M9902 Segmental and somatic dysfunction of thoracic region: Secondary | ICD-10-CM | POA: Diagnosis not present

## 2021-04-03 DIAGNOSIS — F411 Generalized anxiety disorder: Secondary | ICD-10-CM | POA: Diagnosis not present

## 2021-04-04 DIAGNOSIS — H3500 Unspecified background retinopathy: Secondary | ICD-10-CM | POA: Diagnosis not present

## 2021-04-04 DIAGNOSIS — H52203 Unspecified astigmatism, bilateral: Secondary | ICD-10-CM | POA: Diagnosis not present

## 2021-04-10 DIAGNOSIS — M7541 Impingement syndrome of right shoulder: Secondary | ICD-10-CM | POA: Diagnosis not present

## 2021-04-10 DIAGNOSIS — M9906 Segmental and somatic dysfunction of lower extremity: Secondary | ICD-10-CM | POA: Diagnosis not present

## 2021-04-10 DIAGNOSIS — M9902 Segmental and somatic dysfunction of thoracic region: Secondary | ICD-10-CM | POA: Diagnosis not present

## 2021-04-10 DIAGNOSIS — M9901 Segmental and somatic dysfunction of cervical region: Secondary | ICD-10-CM | POA: Diagnosis not present

## 2021-04-24 DIAGNOSIS — F411 Generalized anxiety disorder: Secondary | ICD-10-CM | POA: Diagnosis not present

## 2021-04-25 DIAGNOSIS — H3500 Unspecified background retinopathy: Secondary | ICD-10-CM | POA: Diagnosis not present

## 2021-05-01 DIAGNOSIS — M9902 Segmental and somatic dysfunction of thoracic region: Secondary | ICD-10-CM | POA: Diagnosis not present

## 2021-05-01 DIAGNOSIS — M7541 Impingement syndrome of right shoulder: Secondary | ICD-10-CM | POA: Diagnosis not present

## 2021-05-01 DIAGNOSIS — M9901 Segmental and somatic dysfunction of cervical region: Secondary | ICD-10-CM | POA: Diagnosis not present

## 2021-05-01 DIAGNOSIS — M9906 Segmental and somatic dysfunction of lower extremity: Secondary | ICD-10-CM | POA: Diagnosis not present

## 2021-05-15 DIAGNOSIS — F411 Generalized anxiety disorder: Secondary | ICD-10-CM | POA: Diagnosis not present

## 2021-05-16 DIAGNOSIS — M9906 Segmental and somatic dysfunction of lower extremity: Secondary | ICD-10-CM | POA: Diagnosis not present

## 2021-05-16 DIAGNOSIS — M9901 Segmental and somatic dysfunction of cervical region: Secondary | ICD-10-CM | POA: Diagnosis not present

## 2021-05-16 DIAGNOSIS — M9902 Segmental and somatic dysfunction of thoracic region: Secondary | ICD-10-CM | POA: Diagnosis not present

## 2021-05-16 DIAGNOSIS — M7541 Impingement syndrome of right shoulder: Secondary | ICD-10-CM | POA: Diagnosis not present

## 2021-05-28 ENCOUNTER — Other Ambulatory Visit: Payer: Self-pay | Admitting: Obstetrics and Gynecology

## 2021-05-28 DIAGNOSIS — Z1231 Encounter for screening mammogram for malignant neoplasm of breast: Secondary | ICD-10-CM

## 2021-05-29 DIAGNOSIS — M7541 Impingement syndrome of right shoulder: Secondary | ICD-10-CM | POA: Diagnosis not present

## 2021-05-29 DIAGNOSIS — M9901 Segmental and somatic dysfunction of cervical region: Secondary | ICD-10-CM | POA: Diagnosis not present

## 2021-05-29 DIAGNOSIS — M9906 Segmental and somatic dysfunction of lower extremity: Secondary | ICD-10-CM | POA: Diagnosis not present

## 2021-05-29 DIAGNOSIS — M9902 Segmental and somatic dysfunction of thoracic region: Secondary | ICD-10-CM | POA: Diagnosis not present

## 2021-06-05 DIAGNOSIS — F411 Generalized anxiety disorder: Secondary | ICD-10-CM | POA: Diagnosis not present

## 2021-06-12 DIAGNOSIS — M9901 Segmental and somatic dysfunction of cervical region: Secondary | ICD-10-CM | POA: Diagnosis not present

## 2021-06-12 DIAGNOSIS — M9902 Segmental and somatic dysfunction of thoracic region: Secondary | ICD-10-CM | POA: Diagnosis not present

## 2021-06-12 DIAGNOSIS — M9906 Segmental and somatic dysfunction of lower extremity: Secondary | ICD-10-CM | POA: Diagnosis not present

## 2021-06-12 DIAGNOSIS — M7541 Impingement syndrome of right shoulder: Secondary | ICD-10-CM | POA: Diagnosis not present

## 2021-06-26 DIAGNOSIS — M9901 Segmental and somatic dysfunction of cervical region: Secondary | ICD-10-CM | POA: Diagnosis not present

## 2021-06-26 DIAGNOSIS — M9906 Segmental and somatic dysfunction of lower extremity: Secondary | ICD-10-CM | POA: Diagnosis not present

## 2021-06-26 DIAGNOSIS — M9902 Segmental and somatic dysfunction of thoracic region: Secondary | ICD-10-CM | POA: Diagnosis not present

## 2021-06-26 DIAGNOSIS — M7541 Impingement syndrome of right shoulder: Secondary | ICD-10-CM | POA: Diagnosis not present

## 2021-06-27 DIAGNOSIS — F411 Generalized anxiety disorder: Secondary | ICD-10-CM | POA: Diagnosis not present

## 2021-07-01 ENCOUNTER — Ambulatory Visit
Admission: RE | Admit: 2021-07-01 | Discharge: 2021-07-01 | Disposition: A | Discharge status: Home / Self Care | Payer: BC Managed Care – PPO | Source: Ambulatory Visit | Attending: Obstetrics and Gynecology | Admitting: Obstetrics and Gynecology

## 2021-07-01 ENCOUNTER — Other Ambulatory Visit: Payer: Self-pay

## 2021-07-01 DIAGNOSIS — Z1231 Encounter for screening mammogram for malignant neoplasm of breast: Secondary | ICD-10-CM

## 2021-07-10 DIAGNOSIS — R635 Abnormal weight gain: Secondary | ICD-10-CM | POA: Diagnosis not present

## 2021-07-10 DIAGNOSIS — F418 Other specified anxiety disorders: Secondary | ICD-10-CM | POA: Diagnosis not present

## 2021-07-10 DIAGNOSIS — L659 Nonscarring hair loss, unspecified: Secondary | ICD-10-CM | POA: Diagnosis not present

## 2021-07-11 DIAGNOSIS — M7541 Impingement syndrome of right shoulder: Secondary | ICD-10-CM | POA: Diagnosis not present

## 2021-07-11 DIAGNOSIS — M9902 Segmental and somatic dysfunction of thoracic region: Secondary | ICD-10-CM | POA: Diagnosis not present

## 2021-07-11 DIAGNOSIS — M9906 Segmental and somatic dysfunction of lower extremity: Secondary | ICD-10-CM | POA: Diagnosis not present

## 2021-07-11 DIAGNOSIS — M9901 Segmental and somatic dysfunction of cervical region: Secondary | ICD-10-CM | POA: Diagnosis not present

## 2021-07-17 DIAGNOSIS — F411 Generalized anxiety disorder: Secondary | ICD-10-CM | POA: Diagnosis not present

## 2021-07-25 DIAGNOSIS — M9902 Segmental and somatic dysfunction of thoracic region: Secondary | ICD-10-CM | POA: Diagnosis not present

## 2021-07-25 DIAGNOSIS — M7541 Impingement syndrome of right shoulder: Secondary | ICD-10-CM | POA: Diagnosis not present

## 2021-07-25 DIAGNOSIS — M9906 Segmental and somatic dysfunction of lower extremity: Secondary | ICD-10-CM | POA: Diagnosis not present

## 2021-07-25 DIAGNOSIS — M9901 Segmental and somatic dysfunction of cervical region: Secondary | ICD-10-CM | POA: Diagnosis not present

## 2021-08-14 DIAGNOSIS — R69 Illness, unspecified: Secondary | ICD-10-CM | POA: Diagnosis not present

## 2021-08-15 DIAGNOSIS — M9901 Segmental and somatic dysfunction of cervical region: Secondary | ICD-10-CM | POA: Diagnosis not present

## 2021-08-15 DIAGNOSIS — M9902 Segmental and somatic dysfunction of thoracic region: Secondary | ICD-10-CM | POA: Diagnosis not present

## 2021-08-15 DIAGNOSIS — M9903 Segmental and somatic dysfunction of lumbar region: Secondary | ICD-10-CM | POA: Diagnosis not present

## 2021-08-15 DIAGNOSIS — M9905 Segmental and somatic dysfunction of pelvic region: Secondary | ICD-10-CM | POA: Diagnosis not present

## 2021-08-29 DIAGNOSIS — R69 Illness, unspecified: Secondary | ICD-10-CM | POA: Diagnosis not present

## 2021-09-05 DIAGNOSIS — M9905 Segmental and somatic dysfunction of pelvic region: Secondary | ICD-10-CM | POA: Diagnosis not present

## 2021-09-05 DIAGNOSIS — M9901 Segmental and somatic dysfunction of cervical region: Secondary | ICD-10-CM | POA: Diagnosis not present

## 2021-09-05 DIAGNOSIS — M9902 Segmental and somatic dysfunction of thoracic region: Secondary | ICD-10-CM | POA: Diagnosis not present

## 2021-09-05 DIAGNOSIS — M9903 Segmental and somatic dysfunction of lumbar region: Secondary | ICD-10-CM | POA: Diagnosis not present

## 2021-09-11 DIAGNOSIS — R69 Illness, unspecified: Secondary | ICD-10-CM | POA: Diagnosis not present

## 2021-09-19 DIAGNOSIS — R69 Illness, unspecified: Secondary | ICD-10-CM | POA: Diagnosis not present

## 2021-09-25 DIAGNOSIS — R69 Illness, unspecified: Secondary | ICD-10-CM | POA: Diagnosis not present

## 2021-09-26 DIAGNOSIS — M9902 Segmental and somatic dysfunction of thoracic region: Secondary | ICD-10-CM | POA: Diagnosis not present

## 2021-09-26 DIAGNOSIS — M9905 Segmental and somatic dysfunction of pelvic region: Secondary | ICD-10-CM | POA: Diagnosis not present

## 2021-09-26 DIAGNOSIS — M9901 Segmental and somatic dysfunction of cervical region: Secondary | ICD-10-CM | POA: Diagnosis not present

## 2021-09-26 DIAGNOSIS — M9903 Segmental and somatic dysfunction of lumbar region: Secondary | ICD-10-CM | POA: Diagnosis not present

## 2021-10-02 DIAGNOSIS — R69 Illness, unspecified: Secondary | ICD-10-CM | POA: Diagnosis not present

## 2021-10-09 DIAGNOSIS — R69 Illness, unspecified: Secondary | ICD-10-CM | POA: Diagnosis not present

## 2021-10-11 ENCOUNTER — Other Ambulatory Visit (HOSPITAL_COMMUNITY)
Admission: RE | Admit: 2021-10-11 | Discharge: 2021-10-11 | Disposition: A | Discharge status: Home / Self Care | Payer: 59 | Source: Ambulatory Visit | Attending: Obstetrics and Gynecology | Admitting: Obstetrics and Gynecology

## 2021-10-11 ENCOUNTER — Other Ambulatory Visit: Payer: Self-pay | Admitting: Obstetrics and Gynecology

## 2021-10-11 DIAGNOSIS — Z01419 Encounter for gynecological examination (general) (routine) without abnormal findings: Secondary | ICD-10-CM | POA: Insufficient documentation

## 2021-10-11 DIAGNOSIS — R41840 Attention and concentration deficit: Secondary | ICD-10-CM | POA: Diagnosis not present

## 2021-10-17 DIAGNOSIS — M9905 Segmental and somatic dysfunction of pelvic region: Secondary | ICD-10-CM | POA: Diagnosis not present

## 2021-10-17 DIAGNOSIS — M9903 Segmental and somatic dysfunction of lumbar region: Secondary | ICD-10-CM | POA: Diagnosis not present

## 2021-10-17 DIAGNOSIS — M9902 Segmental and somatic dysfunction of thoracic region: Secondary | ICD-10-CM | POA: Diagnosis not present

## 2021-10-17 DIAGNOSIS — M9901 Segmental and somatic dysfunction of cervical region: Secondary | ICD-10-CM | POA: Diagnosis not present

## 2021-10-17 LAB — CYTOLOGY - PAP
Comment: NEGATIVE
Diagnosis: NEGATIVE
Diagnosis: REACTIVE
High risk HPV: NEGATIVE

## 2021-10-23 DIAGNOSIS — R69 Illness, unspecified: Secondary | ICD-10-CM | POA: Diagnosis not present

## 2021-10-30 DIAGNOSIS — R69 Illness, unspecified: Secondary | ICD-10-CM | POA: Diagnosis not present

## 2021-10-31 DIAGNOSIS — M9902 Segmental and somatic dysfunction of thoracic region: Secondary | ICD-10-CM | POA: Diagnosis not present

## 2021-10-31 DIAGNOSIS — M9903 Segmental and somatic dysfunction of lumbar region: Secondary | ICD-10-CM | POA: Diagnosis not present

## 2021-10-31 DIAGNOSIS — M9901 Segmental and somatic dysfunction of cervical region: Secondary | ICD-10-CM | POA: Diagnosis not present

## 2021-10-31 DIAGNOSIS — M9905 Segmental and somatic dysfunction of pelvic region: Secondary | ICD-10-CM | POA: Diagnosis not present

## 2021-11-06 DIAGNOSIS — R69 Illness, unspecified: Secondary | ICD-10-CM | POA: Diagnosis not present

## 2021-11-13 DIAGNOSIS — R69 Illness, unspecified: Secondary | ICD-10-CM | POA: Diagnosis not present

## 2021-11-15 DIAGNOSIS — M9901 Segmental and somatic dysfunction of cervical region: Secondary | ICD-10-CM | POA: Diagnosis not present

## 2021-11-15 DIAGNOSIS — M9902 Segmental and somatic dysfunction of thoracic region: Secondary | ICD-10-CM | POA: Diagnosis not present

## 2021-11-15 DIAGNOSIS — M9905 Segmental and somatic dysfunction of pelvic region: Secondary | ICD-10-CM | POA: Diagnosis not present

## 2021-11-15 DIAGNOSIS — R69 Illness, unspecified: Secondary | ICD-10-CM | POA: Diagnosis not present

## 2021-11-15 DIAGNOSIS — M9903 Segmental and somatic dysfunction of lumbar region: Secondary | ICD-10-CM | POA: Diagnosis not present

## 2021-11-15 DIAGNOSIS — F429 Obsessive-compulsive disorder, unspecified: Secondary | ICD-10-CM | POA: Diagnosis not present

## 2021-11-15 DIAGNOSIS — F411 Generalized anxiety disorder: Secondary | ICD-10-CM | POA: Diagnosis not present

## 2021-11-20 DIAGNOSIS — R69 Illness, unspecified: Secondary | ICD-10-CM | POA: Diagnosis not present

## 2021-11-28 DIAGNOSIS — R69 Illness, unspecified: Secondary | ICD-10-CM | POA: Diagnosis not present

## 2021-12-04 DIAGNOSIS — M9903 Segmental and somatic dysfunction of lumbar region: Secondary | ICD-10-CM | POA: Diagnosis not present

## 2021-12-04 DIAGNOSIS — M9905 Segmental and somatic dysfunction of pelvic region: Secondary | ICD-10-CM | POA: Diagnosis not present

## 2021-12-04 DIAGNOSIS — M9901 Segmental and somatic dysfunction of cervical region: Secondary | ICD-10-CM | POA: Diagnosis not present

## 2021-12-04 DIAGNOSIS — M9902 Segmental and somatic dysfunction of thoracic region: Secondary | ICD-10-CM | POA: Diagnosis not present

## 2021-12-05 DIAGNOSIS — R69 Illness, unspecified: Secondary | ICD-10-CM | POA: Diagnosis not present

## 2021-12-12 DIAGNOSIS — F411 Generalized anxiety disorder: Secondary | ICD-10-CM | POA: Diagnosis not present

## 2021-12-12 DIAGNOSIS — R69 Illness, unspecified: Secondary | ICD-10-CM | POA: Diagnosis not present

## 2021-12-12 DIAGNOSIS — F429 Obsessive-compulsive disorder, unspecified: Secondary | ICD-10-CM | POA: Diagnosis not present

## 2021-12-13 DIAGNOSIS — R69 Illness, unspecified: Secondary | ICD-10-CM | POA: Diagnosis not present

## 2021-12-18 DIAGNOSIS — R69 Illness, unspecified: Secondary | ICD-10-CM | POA: Diagnosis not present

## 2021-12-26 DIAGNOSIS — M9902 Segmental and somatic dysfunction of thoracic region: Secondary | ICD-10-CM | POA: Diagnosis not present

## 2021-12-26 DIAGNOSIS — M9905 Segmental and somatic dysfunction of pelvic region: Secondary | ICD-10-CM | POA: Diagnosis not present

## 2021-12-26 DIAGNOSIS — M9901 Segmental and somatic dysfunction of cervical region: Secondary | ICD-10-CM | POA: Diagnosis not present

## 2021-12-26 DIAGNOSIS — M9903 Segmental and somatic dysfunction of lumbar region: Secondary | ICD-10-CM | POA: Diagnosis not present

## 2021-12-27 DIAGNOSIS — R69 Illness, unspecified: Secondary | ICD-10-CM | POA: Diagnosis not present

## 2022-01-01 DIAGNOSIS — R69 Illness, unspecified: Secondary | ICD-10-CM | POA: Diagnosis not present

## 2022-01-08 DIAGNOSIS — R69 Illness, unspecified: Secondary | ICD-10-CM | POA: Diagnosis not present

## 2022-01-09 DIAGNOSIS — M9903 Segmental and somatic dysfunction of lumbar region: Secondary | ICD-10-CM | POA: Diagnosis not present

## 2022-01-09 DIAGNOSIS — M9902 Segmental and somatic dysfunction of thoracic region: Secondary | ICD-10-CM | POA: Diagnosis not present

## 2022-01-09 DIAGNOSIS — M9905 Segmental and somatic dysfunction of pelvic region: Secondary | ICD-10-CM | POA: Diagnosis not present

## 2022-01-09 DIAGNOSIS — M9901 Segmental and somatic dysfunction of cervical region: Secondary | ICD-10-CM | POA: Diagnosis not present

## 2022-01-16 DIAGNOSIS — F411 Generalized anxiety disorder: Secondary | ICD-10-CM | POA: Diagnosis not present

## 2022-01-16 DIAGNOSIS — R69 Illness, unspecified: Secondary | ICD-10-CM | POA: Diagnosis not present

## 2022-01-16 DIAGNOSIS — F429 Obsessive-compulsive disorder, unspecified: Secondary | ICD-10-CM | POA: Diagnosis not present

## 2022-01-17 DIAGNOSIS — R69 Illness, unspecified: Secondary | ICD-10-CM | POA: Diagnosis not present

## 2022-01-22 DIAGNOSIS — E559 Vitamin D deficiency, unspecified: Secondary | ICD-10-CM | POA: Diagnosis not present

## 2022-01-22 DIAGNOSIS — Z13220 Encounter for screening for lipoid disorders: Secondary | ICD-10-CM | POA: Diagnosis not present

## 2022-01-22 DIAGNOSIS — Z0000 Encounter for general adult medical examination without abnormal findings: Secondary | ICD-10-CM | POA: Diagnosis not present

## 2022-01-28 DIAGNOSIS — E860 Dehydration: Secondary | ICD-10-CM | POA: Diagnosis not present

## 2022-01-29 DIAGNOSIS — R69 Illness, unspecified: Secondary | ICD-10-CM | POA: Diagnosis not present

## 2022-01-30 DIAGNOSIS — M9901 Segmental and somatic dysfunction of cervical region: Secondary | ICD-10-CM | POA: Diagnosis not present

## 2022-01-30 DIAGNOSIS — M9902 Segmental and somatic dysfunction of thoracic region: Secondary | ICD-10-CM | POA: Diagnosis not present

## 2022-01-30 DIAGNOSIS — M9903 Segmental and somatic dysfunction of lumbar region: Secondary | ICD-10-CM | POA: Diagnosis not present

## 2022-01-30 DIAGNOSIS — M9905 Segmental and somatic dysfunction of pelvic region: Secondary | ICD-10-CM | POA: Diagnosis not present

## 2022-02-05 DIAGNOSIS — R69 Illness, unspecified: Secondary | ICD-10-CM | POA: Diagnosis not present

## 2022-02-06 DIAGNOSIS — M9905 Segmental and somatic dysfunction of pelvic region: Secondary | ICD-10-CM | POA: Diagnosis not present

## 2022-02-06 DIAGNOSIS — M9901 Segmental and somatic dysfunction of cervical region: Secondary | ICD-10-CM | POA: Diagnosis not present

## 2022-02-06 DIAGNOSIS — M9903 Segmental and somatic dysfunction of lumbar region: Secondary | ICD-10-CM | POA: Diagnosis not present

## 2022-02-06 DIAGNOSIS — M9902 Segmental and somatic dysfunction of thoracic region: Secondary | ICD-10-CM | POA: Diagnosis not present

## 2022-02-13 DIAGNOSIS — M9902 Segmental and somatic dysfunction of thoracic region: Secondary | ICD-10-CM | POA: Diagnosis not present

## 2022-02-13 DIAGNOSIS — M9905 Segmental and somatic dysfunction of pelvic region: Secondary | ICD-10-CM | POA: Diagnosis not present

## 2022-02-13 DIAGNOSIS — M9903 Segmental and somatic dysfunction of lumbar region: Secondary | ICD-10-CM | POA: Diagnosis not present

## 2022-02-13 DIAGNOSIS — M9901 Segmental and somatic dysfunction of cervical region: Secondary | ICD-10-CM | POA: Diagnosis not present

## 2022-02-14 DIAGNOSIS — R69 Illness, unspecified: Secondary | ICD-10-CM | POA: Diagnosis not present

## 2022-02-25 DIAGNOSIS — M9902 Segmental and somatic dysfunction of thoracic region: Secondary | ICD-10-CM | POA: Diagnosis not present

## 2022-02-25 DIAGNOSIS — M9903 Segmental and somatic dysfunction of lumbar region: Secondary | ICD-10-CM | POA: Diagnosis not present

## 2022-02-25 DIAGNOSIS — M9901 Segmental and somatic dysfunction of cervical region: Secondary | ICD-10-CM | POA: Diagnosis not present

## 2022-02-25 DIAGNOSIS — M9905 Segmental and somatic dysfunction of pelvic region: Secondary | ICD-10-CM | POA: Diagnosis not present

## 2022-02-26 DIAGNOSIS — R69 Illness, unspecified: Secondary | ICD-10-CM | POA: Diagnosis not present

## 2022-02-27 DIAGNOSIS — F429 Obsessive-compulsive disorder, unspecified: Secondary | ICD-10-CM | POA: Diagnosis not present

## 2022-02-27 DIAGNOSIS — R69 Illness, unspecified: Secondary | ICD-10-CM | POA: Diagnosis not present

## 2022-02-27 DIAGNOSIS — F411 Generalized anxiety disorder: Secondary | ICD-10-CM | POA: Diagnosis not present

## 2022-03-05 DIAGNOSIS — R69 Illness, unspecified: Secondary | ICD-10-CM | POA: Diagnosis not present

## 2022-03-06 DIAGNOSIS — M9903 Segmental and somatic dysfunction of lumbar region: Secondary | ICD-10-CM | POA: Diagnosis not present

## 2022-03-06 DIAGNOSIS — M9902 Segmental and somatic dysfunction of thoracic region: Secondary | ICD-10-CM | POA: Diagnosis not present

## 2022-03-06 DIAGNOSIS — M9905 Segmental and somatic dysfunction of pelvic region: Secondary | ICD-10-CM | POA: Diagnosis not present

## 2022-03-06 DIAGNOSIS — M9901 Segmental and somatic dysfunction of cervical region: Secondary | ICD-10-CM | POA: Diagnosis not present

## 2022-03-14 DIAGNOSIS — R69 Illness, unspecified: Secondary | ICD-10-CM | POA: Diagnosis not present

## 2022-03-19 DIAGNOSIS — L209 Atopic dermatitis, unspecified: Secondary | ICD-10-CM | POA: Diagnosis not present

## 2022-03-19 DIAGNOSIS — L298 Other pruritus: Secondary | ICD-10-CM | POA: Diagnosis not present

## 2022-03-19 DIAGNOSIS — L2089 Other atopic dermatitis: Secondary | ICD-10-CM | POA: Diagnosis not present

## 2022-03-20 DIAGNOSIS — M9905 Segmental and somatic dysfunction of pelvic region: Secondary | ICD-10-CM | POA: Diagnosis not present

## 2022-03-20 DIAGNOSIS — M9903 Segmental and somatic dysfunction of lumbar region: Secondary | ICD-10-CM | POA: Diagnosis not present

## 2022-03-20 DIAGNOSIS — M9902 Segmental and somatic dysfunction of thoracic region: Secondary | ICD-10-CM | POA: Diagnosis not present

## 2022-03-20 DIAGNOSIS — M9901 Segmental and somatic dysfunction of cervical region: Secondary | ICD-10-CM | POA: Diagnosis not present

## 2022-03-21 DIAGNOSIS — R69 Illness, unspecified: Secondary | ICD-10-CM | POA: Diagnosis not present

## 2022-03-27 DIAGNOSIS — F411 Generalized anxiety disorder: Secondary | ICD-10-CM | POA: Diagnosis not present

## 2022-03-27 DIAGNOSIS — R69 Illness, unspecified: Secondary | ICD-10-CM | POA: Diagnosis not present

## 2022-03-27 DIAGNOSIS — F429 Obsessive-compulsive disorder, unspecified: Secondary | ICD-10-CM | POA: Diagnosis not present

## 2022-03-30 IMAGING — MG MM DIGITAL SCREENING BILAT W/ TOMO AND CAD
8 series · 8 of 24 positions shown · non-contrast
Comparison: Previous exam(s).

CLINICAL DATA: Screening.

EXAM:
DIGITAL SCREENING BILATERAL MAMMOGRAM WITH TOMOSYNTHESIS AND CAD
TECHNIQUE: Bilateral screening digital craniocaudal and mediolateral oblique
mammograms were obtained. Bilateral screening digital breast
tomosynthesis was performed. The images were evaluated with
computer-aided detection.

[R CC synth-2D]
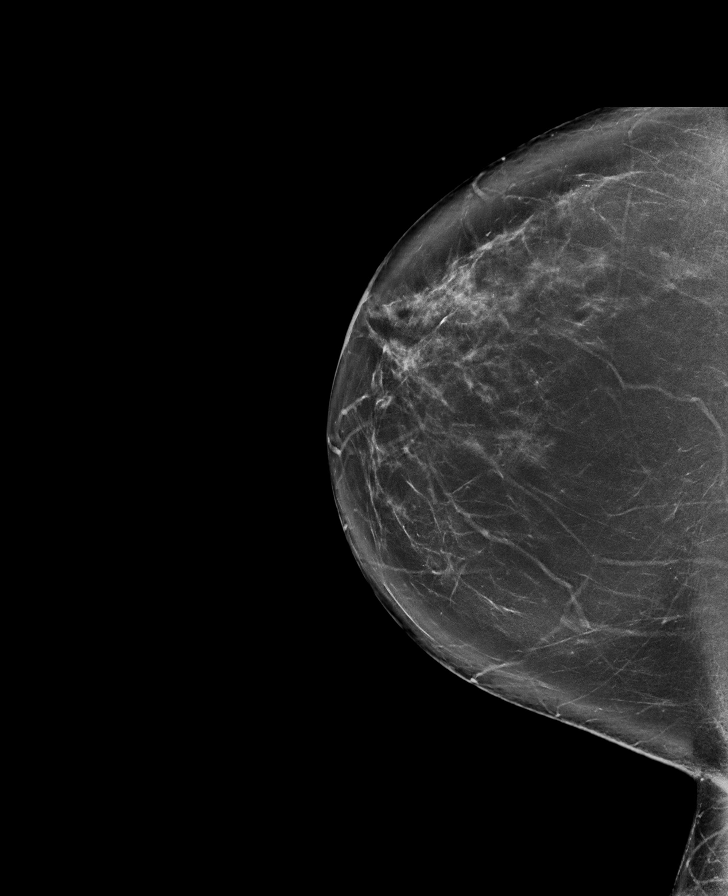

[L CC synth-2D]
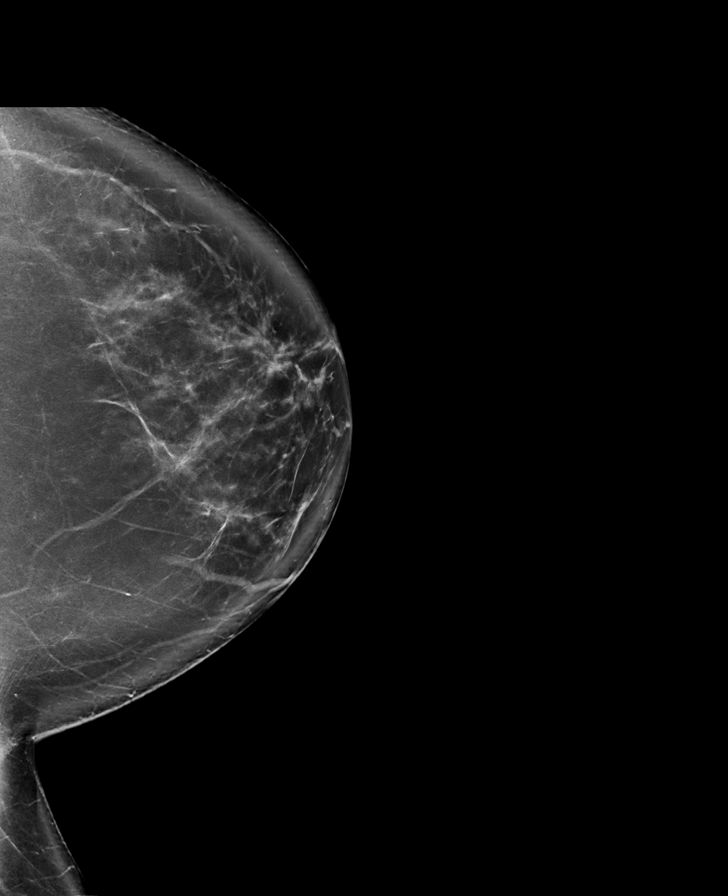

[L MLO synth-2D]
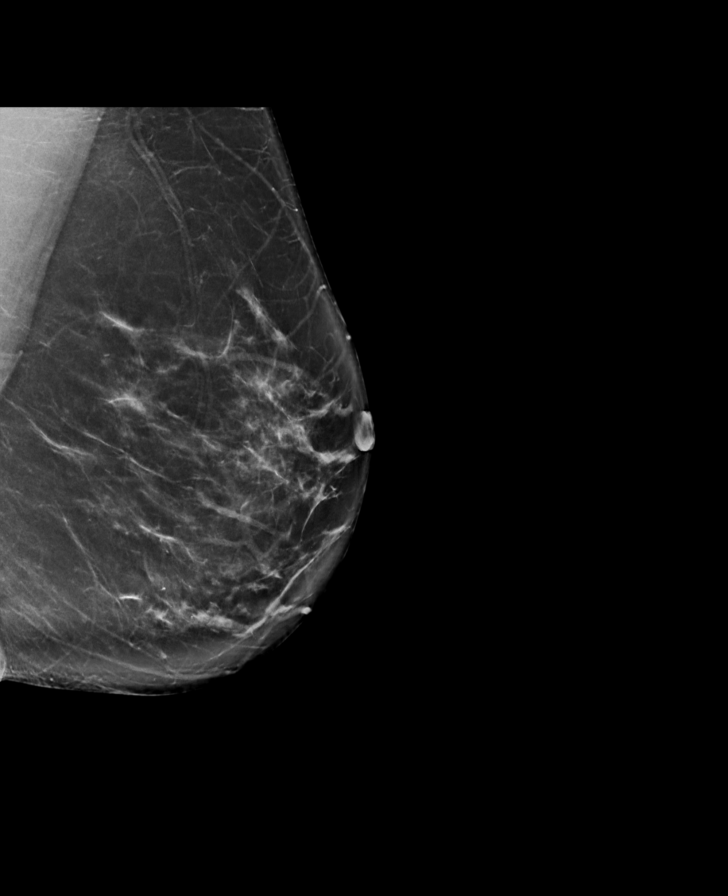

[R MLO synth-2D]
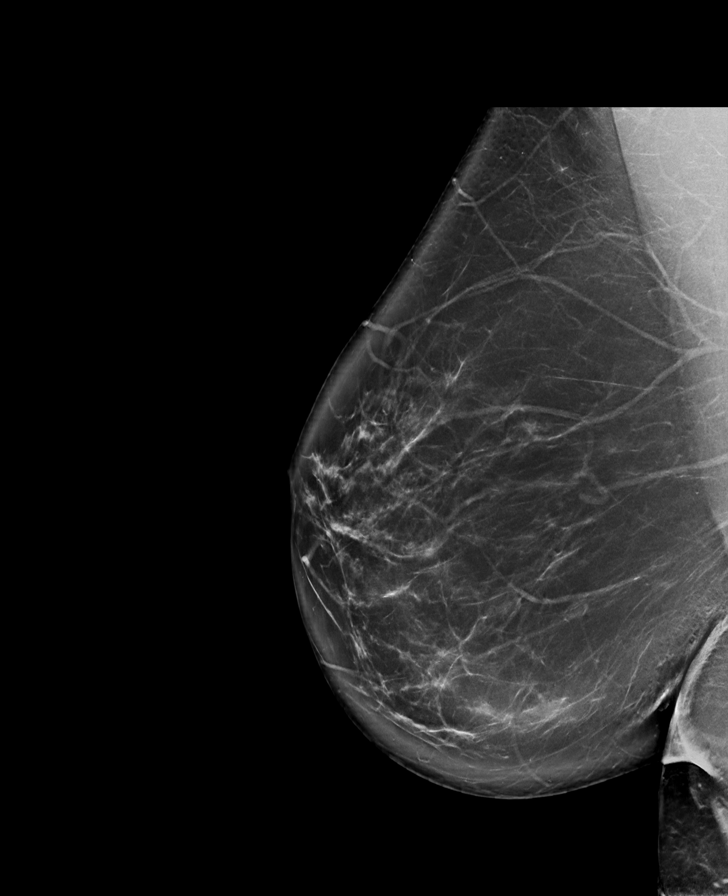

[R CC tomo · tomo slice 48/95.0]
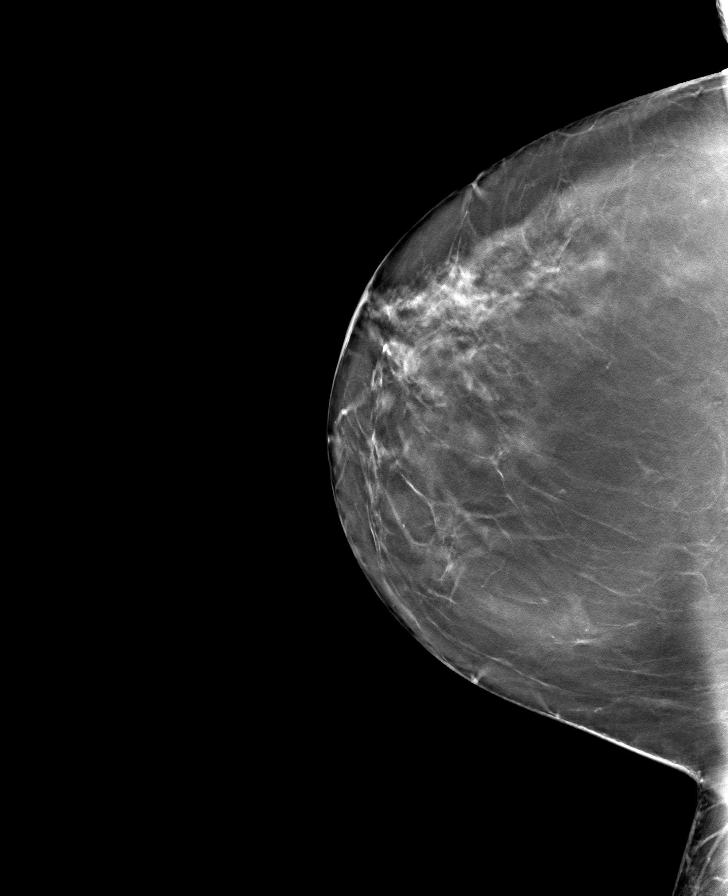

[R MLO tomo · tomo slice 49/97.0]
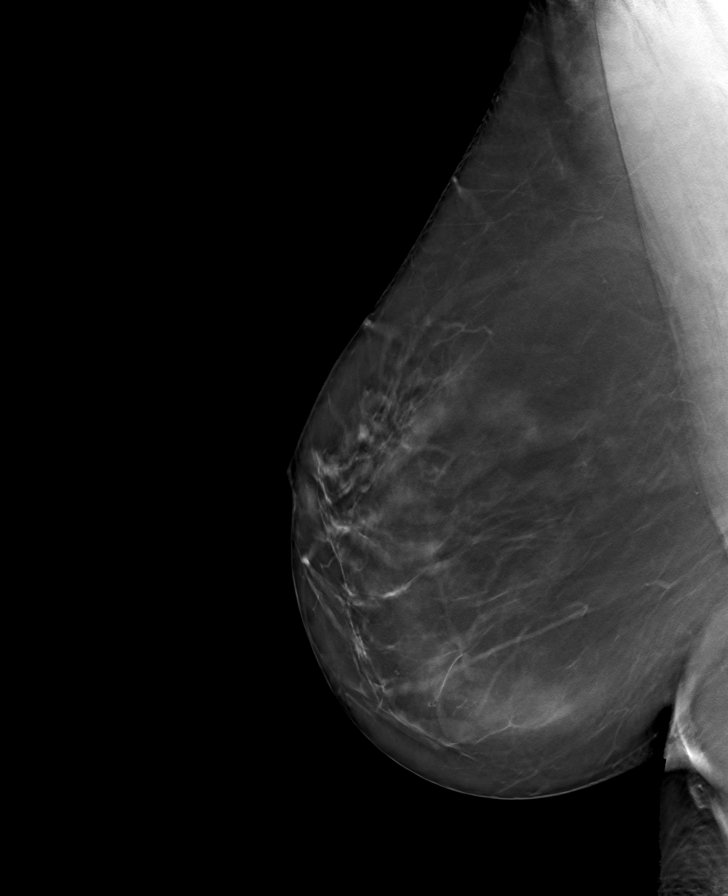

[L MLO tomo · tomo slice 47/94.0]
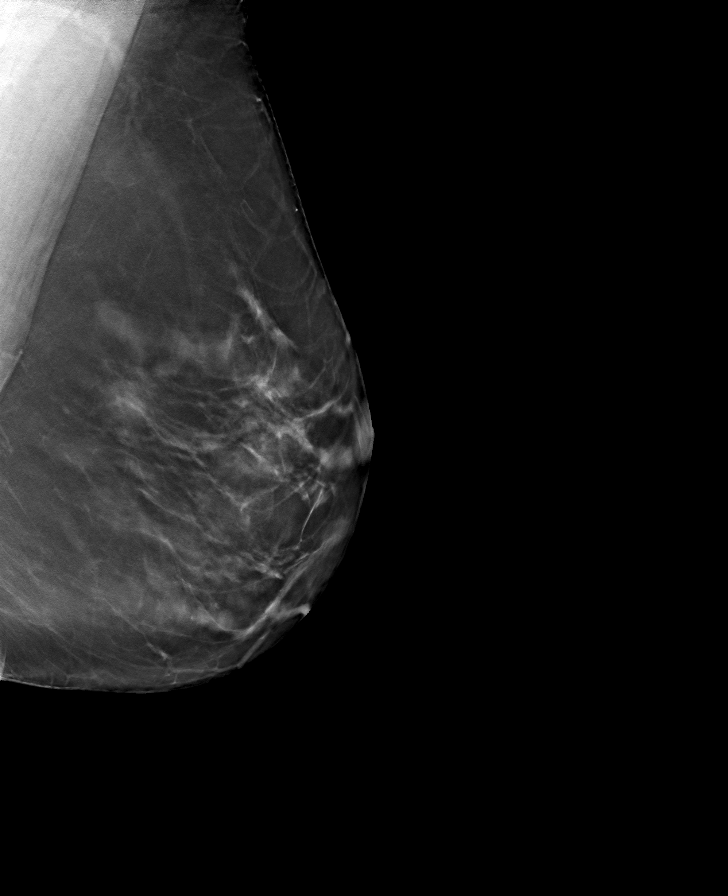

[L CC tomo · tomo slice 49/96.0]
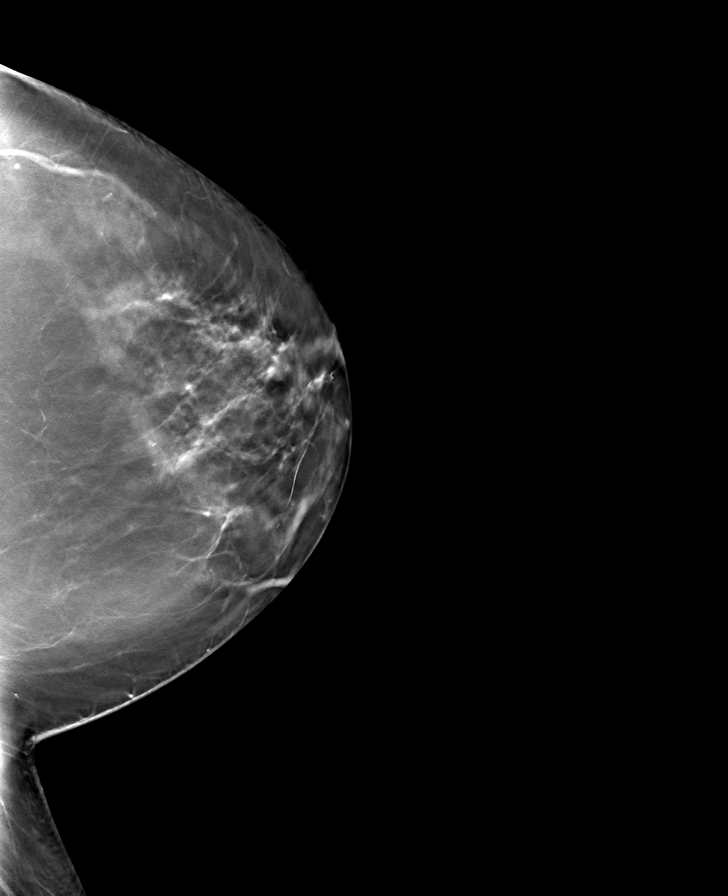

[8 of 24 positions shown; findings below may reference images not displayed]

ACR Breast Density Category b: There are scattered areas of
fibroglandular density.
FINDINGS: There are no findings suspicious for malignancy.
IMPRESSION: No mammographic evidence of malignancy. A result letter of this
screening mammogram will be mailed directly to the patient.

RECOMMENDATION:
Screening mammogram in one year. (Code:[id number])

BI-RADS CATEGORY  1: Negative.

## 2022-04-03 DIAGNOSIS — R69 Illness, unspecified: Secondary | ICD-10-CM | POA: Diagnosis not present

## 2022-04-09 DIAGNOSIS — M9901 Segmental and somatic dysfunction of cervical region: Secondary | ICD-10-CM | POA: Diagnosis not present

## 2022-04-09 DIAGNOSIS — M9905 Segmental and somatic dysfunction of pelvic region: Secondary | ICD-10-CM | POA: Diagnosis not present

## 2022-04-09 DIAGNOSIS — M9902 Segmental and somatic dysfunction of thoracic region: Secondary | ICD-10-CM | POA: Diagnosis not present

## 2022-04-09 DIAGNOSIS — M9903 Segmental and somatic dysfunction of lumbar region: Secondary | ICD-10-CM | POA: Diagnosis not present

## 2022-04-10 DIAGNOSIS — R69 Illness, unspecified: Secondary | ICD-10-CM | POA: Diagnosis not present

## 2022-04-25 DIAGNOSIS — R69 Illness, unspecified: Secondary | ICD-10-CM | POA: Diagnosis not present

## 2022-05-01 DIAGNOSIS — M9902 Segmental and somatic dysfunction of thoracic region: Secondary | ICD-10-CM | POA: Diagnosis not present

## 2022-05-01 DIAGNOSIS — M9905 Segmental and somatic dysfunction of pelvic region: Secondary | ICD-10-CM | POA: Diagnosis not present

## 2022-05-01 DIAGNOSIS — M9901 Segmental and somatic dysfunction of cervical region: Secondary | ICD-10-CM | POA: Diagnosis not present

## 2022-05-01 DIAGNOSIS — M9903 Segmental and somatic dysfunction of lumbar region: Secondary | ICD-10-CM | POA: Diagnosis not present

## 2022-05-08 DIAGNOSIS — R69 Illness, unspecified: Secondary | ICD-10-CM | POA: Diagnosis not present

## 2022-05-14 DIAGNOSIS — M9903 Segmental and somatic dysfunction of lumbar region: Secondary | ICD-10-CM | POA: Diagnosis not present

## 2022-05-14 DIAGNOSIS — M9905 Segmental and somatic dysfunction of pelvic region: Secondary | ICD-10-CM | POA: Diagnosis not present

## 2022-05-14 DIAGNOSIS — M9902 Segmental and somatic dysfunction of thoracic region: Secondary | ICD-10-CM | POA: Diagnosis not present

## 2022-05-14 DIAGNOSIS — M9901 Segmental and somatic dysfunction of cervical region: Secondary | ICD-10-CM | POA: Diagnosis not present

## 2022-05-15 DIAGNOSIS — R69 Illness, unspecified: Secondary | ICD-10-CM | POA: Diagnosis not present

## 2022-05-22 DIAGNOSIS — R69 Illness, unspecified: Secondary | ICD-10-CM | POA: Diagnosis not present

## 2022-05-28 DIAGNOSIS — M9901 Segmental and somatic dysfunction of cervical region: Secondary | ICD-10-CM | POA: Diagnosis not present

## 2022-05-28 DIAGNOSIS — M9902 Segmental and somatic dysfunction of thoracic region: Secondary | ICD-10-CM | POA: Diagnosis not present

## 2022-05-28 DIAGNOSIS — M9903 Segmental and somatic dysfunction of lumbar region: Secondary | ICD-10-CM | POA: Diagnosis not present

## 2022-05-28 DIAGNOSIS — M9905 Segmental and somatic dysfunction of pelvic region: Secondary | ICD-10-CM | POA: Diagnosis not present

## 2022-05-29 DIAGNOSIS — R69 Illness, unspecified: Secondary | ICD-10-CM | POA: Diagnosis not present

## 2022-06-05 DIAGNOSIS — R69 Illness, unspecified: Secondary | ICD-10-CM | POA: Diagnosis not present

## 2022-06-11 DIAGNOSIS — J301 Allergic rhinitis due to pollen: Secondary | ICD-10-CM | POA: Diagnosis not present

## 2022-06-11 DIAGNOSIS — R21 Rash and other nonspecific skin eruption: Secondary | ICD-10-CM | POA: Diagnosis not present

## 2022-06-11 DIAGNOSIS — H1045 Other chronic allergic conjunctivitis: Secondary | ICD-10-CM | POA: Diagnosis not present

## 2022-06-12 DIAGNOSIS — R69 Illness, unspecified: Secondary | ICD-10-CM | POA: Diagnosis not present

## 2022-06-13 DIAGNOSIS — M9905 Segmental and somatic dysfunction of pelvic region: Secondary | ICD-10-CM | POA: Diagnosis not present

## 2022-06-13 DIAGNOSIS — M9903 Segmental and somatic dysfunction of lumbar region: Secondary | ICD-10-CM | POA: Diagnosis not present

## 2022-06-13 DIAGNOSIS — M9902 Segmental and somatic dysfunction of thoracic region: Secondary | ICD-10-CM | POA: Diagnosis not present

## 2022-06-13 DIAGNOSIS — M9901 Segmental and somatic dysfunction of cervical region: Secondary | ICD-10-CM | POA: Diagnosis not present

## 2022-06-19 DIAGNOSIS — R69 Illness, unspecified: Secondary | ICD-10-CM | POA: Diagnosis not present

## 2022-06-19 DIAGNOSIS — F411 Generalized anxiety disorder: Secondary | ICD-10-CM | POA: Diagnosis not present

## 2022-06-19 DIAGNOSIS — F429 Obsessive-compulsive disorder, unspecified: Secondary | ICD-10-CM | POA: Diagnosis not present

## 2022-06-20 DIAGNOSIS — R69 Illness, unspecified: Secondary | ICD-10-CM | POA: Diagnosis not present

## 2022-06-25 DIAGNOSIS — M9905 Segmental and somatic dysfunction of pelvic region: Secondary | ICD-10-CM | POA: Diagnosis not present

## 2022-06-25 DIAGNOSIS — M9901 Segmental and somatic dysfunction of cervical region: Secondary | ICD-10-CM | POA: Diagnosis not present

## 2022-06-25 DIAGNOSIS — M9902 Segmental and somatic dysfunction of thoracic region: Secondary | ICD-10-CM | POA: Diagnosis not present

## 2022-06-25 DIAGNOSIS — M9903 Segmental and somatic dysfunction of lumbar region: Secondary | ICD-10-CM | POA: Diagnosis not present

## 2022-06-26 DIAGNOSIS — L2089 Other atopic dermatitis: Secondary | ICD-10-CM | POA: Diagnosis not present

## 2022-06-26 DIAGNOSIS — L710 Perioral dermatitis: Secondary | ICD-10-CM | POA: Diagnosis not present

## 2022-06-27 DIAGNOSIS — R69 Illness, unspecified: Secondary | ICD-10-CM | POA: Diagnosis not present

## 2022-07-08 ENCOUNTER — Other Ambulatory Visit: Payer: Self-pay | Admitting: Obstetrics and Gynecology

## 2022-07-08 DIAGNOSIS — Z1231 Encounter for screening mammogram for malignant neoplasm of breast: Secondary | ICD-10-CM

## 2022-07-09 DIAGNOSIS — M9903 Segmental and somatic dysfunction of lumbar region: Secondary | ICD-10-CM | POA: Diagnosis not present

## 2022-07-09 DIAGNOSIS — M9905 Segmental and somatic dysfunction of pelvic region: Secondary | ICD-10-CM | POA: Diagnosis not present

## 2022-07-09 DIAGNOSIS — M9901 Segmental and somatic dysfunction of cervical region: Secondary | ICD-10-CM | POA: Diagnosis not present

## 2022-07-09 DIAGNOSIS — M9902 Segmental and somatic dysfunction of thoracic region: Secondary | ICD-10-CM | POA: Diagnosis not present

## 2022-07-10 DIAGNOSIS — R69 Illness, unspecified: Secondary | ICD-10-CM | POA: Diagnosis not present

## 2022-07-17 DIAGNOSIS — R69 Illness, unspecified: Secondary | ICD-10-CM | POA: Diagnosis not present

## 2022-07-24 DIAGNOSIS — R69 Illness, unspecified: Secondary | ICD-10-CM | POA: Diagnosis not present

## 2022-07-30 DIAGNOSIS — M9903 Segmental and somatic dysfunction of lumbar region: Secondary | ICD-10-CM | POA: Diagnosis not present

## 2022-07-30 DIAGNOSIS — M9902 Segmental and somatic dysfunction of thoracic region: Secondary | ICD-10-CM | POA: Diagnosis not present

## 2022-07-30 DIAGNOSIS — M9901 Segmental and somatic dysfunction of cervical region: Secondary | ICD-10-CM | POA: Diagnosis not present

## 2022-07-30 DIAGNOSIS — M9905 Segmental and somatic dysfunction of pelvic region: Secondary | ICD-10-CM | POA: Diagnosis not present

## 2022-07-31 DIAGNOSIS — R69 Illness, unspecified: Secondary | ICD-10-CM | POA: Diagnosis not present

## 2022-08-13 DIAGNOSIS — M9903 Segmental and somatic dysfunction of lumbar region: Secondary | ICD-10-CM | POA: Diagnosis not present

## 2022-08-13 DIAGNOSIS — M9905 Segmental and somatic dysfunction of pelvic region: Secondary | ICD-10-CM | POA: Diagnosis not present

## 2022-08-13 DIAGNOSIS — M9901 Segmental and somatic dysfunction of cervical region: Secondary | ICD-10-CM | POA: Diagnosis not present

## 2022-08-13 DIAGNOSIS — M9902 Segmental and somatic dysfunction of thoracic region: Secondary | ICD-10-CM | POA: Diagnosis not present

## 2022-08-21 DIAGNOSIS — R69 Illness, unspecified: Secondary | ICD-10-CM | POA: Diagnosis not present

## 2022-08-27 DIAGNOSIS — M9902 Segmental and somatic dysfunction of thoracic region: Secondary | ICD-10-CM | POA: Diagnosis not present

## 2022-08-27 DIAGNOSIS — M9905 Segmental and somatic dysfunction of pelvic region: Secondary | ICD-10-CM | POA: Diagnosis not present

## 2022-08-27 DIAGNOSIS — M9901 Segmental and somatic dysfunction of cervical region: Secondary | ICD-10-CM | POA: Diagnosis not present

## 2022-08-27 DIAGNOSIS — M9903 Segmental and somatic dysfunction of lumbar region: Secondary | ICD-10-CM | POA: Diagnosis not present

## 2022-08-28 DIAGNOSIS — L710 Perioral dermatitis: Secondary | ICD-10-CM | POA: Diagnosis not present

## 2022-08-28 DIAGNOSIS — L2089 Other atopic dermatitis: Secondary | ICD-10-CM | POA: Diagnosis not present

## 2022-09-01 ENCOUNTER — Ambulatory Visit
Admission: RE | Admit: 2022-09-01 | Discharge: 2022-09-01 | Disposition: A | Discharge status: Home / Self Care | Payer: 59 | Source: Ambulatory Visit | Attending: Obstetrics and Gynecology | Admitting: Obstetrics and Gynecology

## 2022-09-01 DIAGNOSIS — Z1231 Encounter for screening mammogram for malignant neoplasm of breast: Secondary | ICD-10-CM | POA: Diagnosis not present

## 2022-09-04 DIAGNOSIS — R69 Illness, unspecified: Secondary | ICD-10-CM | POA: Diagnosis not present

## 2022-09-10 DIAGNOSIS — M9905 Segmental and somatic dysfunction of pelvic region: Secondary | ICD-10-CM | POA: Diagnosis not present

## 2022-09-10 DIAGNOSIS — M9902 Segmental and somatic dysfunction of thoracic region: Secondary | ICD-10-CM | POA: Diagnosis not present

## 2022-09-10 DIAGNOSIS — M9903 Segmental and somatic dysfunction of lumbar region: Secondary | ICD-10-CM | POA: Diagnosis not present

## 2022-09-10 DIAGNOSIS — M9901 Segmental and somatic dysfunction of cervical region: Secondary | ICD-10-CM | POA: Diagnosis not present

## 2022-09-12 DIAGNOSIS — R69 Illness, unspecified: Secondary | ICD-10-CM | POA: Diagnosis not present

## 2022-09-17 DIAGNOSIS — F429 Obsessive-compulsive disorder, unspecified: Secondary | ICD-10-CM | POA: Diagnosis not present

## 2022-09-17 DIAGNOSIS — F339 Major depressive disorder, recurrent, unspecified: Secondary | ICD-10-CM | POA: Diagnosis not present

## 2022-09-17 DIAGNOSIS — R69 Illness, unspecified: Secondary | ICD-10-CM | POA: Diagnosis not present

## 2022-09-18 DIAGNOSIS — R69 Illness, unspecified: Secondary | ICD-10-CM | POA: Diagnosis not present

## 2022-09-24 DIAGNOSIS — M9902 Segmental and somatic dysfunction of thoracic region: Secondary | ICD-10-CM | POA: Diagnosis not present

## 2022-09-24 DIAGNOSIS — M9901 Segmental and somatic dysfunction of cervical region: Secondary | ICD-10-CM | POA: Diagnosis not present

## 2022-09-24 DIAGNOSIS — M9903 Segmental and somatic dysfunction of lumbar region: Secondary | ICD-10-CM | POA: Diagnosis not present

## 2022-09-24 DIAGNOSIS — M9905 Segmental and somatic dysfunction of pelvic region: Secondary | ICD-10-CM | POA: Diagnosis not present

## 2022-09-25 DIAGNOSIS — R69 Illness, unspecified: Secondary | ICD-10-CM | POA: Diagnosis not present

## 2022-10-02 DIAGNOSIS — F411 Generalized anxiety disorder: Secondary | ICD-10-CM | POA: Diagnosis not present

## 2022-10-08 DIAGNOSIS — M9901 Segmental and somatic dysfunction of cervical region: Secondary | ICD-10-CM | POA: Diagnosis not present

## 2022-10-08 DIAGNOSIS — M9903 Segmental and somatic dysfunction of lumbar region: Secondary | ICD-10-CM | POA: Diagnosis not present

## 2022-10-08 DIAGNOSIS — M9902 Segmental and somatic dysfunction of thoracic region: Secondary | ICD-10-CM | POA: Diagnosis not present

## 2022-10-08 DIAGNOSIS — M9905 Segmental and somatic dysfunction of pelvic region: Secondary | ICD-10-CM | POA: Diagnosis not present

## 2022-10-09 DIAGNOSIS — F411 Generalized anxiety disorder: Secondary | ICD-10-CM | POA: Diagnosis not present

## 2022-10-15 DIAGNOSIS — Z01419 Encounter for gynecological examination (general) (routine) without abnormal findings: Secondary | ICD-10-CM | POA: Diagnosis not present

## 2022-10-15 DIAGNOSIS — J069 Acute upper respiratory infection, unspecified: Secondary | ICD-10-CM | POA: Diagnosis not present

## 2022-10-16 DIAGNOSIS — F411 Generalized anxiety disorder: Secondary | ICD-10-CM | POA: Diagnosis not present

## 2022-10-22 DIAGNOSIS — M9903 Segmental and somatic dysfunction of lumbar region: Secondary | ICD-10-CM | POA: Diagnosis not present

## 2022-10-22 DIAGNOSIS — M9902 Segmental and somatic dysfunction of thoracic region: Secondary | ICD-10-CM | POA: Diagnosis not present

## 2022-10-22 DIAGNOSIS — M9901 Segmental and somatic dysfunction of cervical region: Secondary | ICD-10-CM | POA: Diagnosis not present

## 2022-10-22 DIAGNOSIS — M9905 Segmental and somatic dysfunction of pelvic region: Secondary | ICD-10-CM | POA: Diagnosis not present

## 2022-10-23 DIAGNOSIS — F411 Generalized anxiety disorder: Secondary | ICD-10-CM | POA: Diagnosis not present

## 2022-10-30 DIAGNOSIS — F411 Generalized anxiety disorder: Secondary | ICD-10-CM | POA: Diagnosis not present

## 2022-11-06 DIAGNOSIS — F411 Generalized anxiety disorder: Secondary | ICD-10-CM | POA: Diagnosis not present

## 2022-11-12 DIAGNOSIS — M9903 Segmental and somatic dysfunction of lumbar region: Secondary | ICD-10-CM | POA: Diagnosis not present

## 2022-11-12 DIAGNOSIS — M9905 Segmental and somatic dysfunction of pelvic region: Secondary | ICD-10-CM | POA: Diagnosis not present

## 2022-11-12 DIAGNOSIS — M9901 Segmental and somatic dysfunction of cervical region: Secondary | ICD-10-CM | POA: Diagnosis not present

## 2022-11-12 DIAGNOSIS — M9902 Segmental and somatic dysfunction of thoracic region: Secondary | ICD-10-CM | POA: Diagnosis not present

## 2022-11-13 DIAGNOSIS — F411 Generalized anxiety disorder: Secondary | ICD-10-CM | POA: Diagnosis not present

## 2022-11-20 DIAGNOSIS — F411 Generalized anxiety disorder: Secondary | ICD-10-CM | POA: Diagnosis not present

## 2022-11-26 DIAGNOSIS — M9901 Segmental and somatic dysfunction of cervical region: Secondary | ICD-10-CM | POA: Diagnosis not present

## 2022-11-26 DIAGNOSIS — M9902 Segmental and somatic dysfunction of thoracic region: Secondary | ICD-10-CM | POA: Diagnosis not present

## 2022-11-26 DIAGNOSIS — M9903 Segmental and somatic dysfunction of lumbar region: Secondary | ICD-10-CM | POA: Diagnosis not present

## 2022-11-26 DIAGNOSIS — M9905 Segmental and somatic dysfunction of pelvic region: Secondary | ICD-10-CM | POA: Diagnosis not present

## 2022-11-27 DIAGNOSIS — F411 Generalized anxiety disorder: Secondary | ICD-10-CM | POA: Diagnosis not present

## 2022-12-11 DIAGNOSIS — F411 Generalized anxiety disorder: Secondary | ICD-10-CM | POA: Diagnosis not present

## 2022-12-12 DIAGNOSIS — M9901 Segmental and somatic dysfunction of cervical region: Secondary | ICD-10-CM | POA: Diagnosis not present

## 2022-12-12 DIAGNOSIS — M9905 Segmental and somatic dysfunction of pelvic region: Secondary | ICD-10-CM | POA: Diagnosis not present

## 2022-12-12 DIAGNOSIS — M9903 Segmental and somatic dysfunction of lumbar region: Secondary | ICD-10-CM | POA: Diagnosis not present

## 2022-12-12 DIAGNOSIS — M9902 Segmental and somatic dysfunction of thoracic region: Secondary | ICD-10-CM | POA: Diagnosis not present

## 2022-12-18 DIAGNOSIS — F411 Generalized anxiety disorder: Secondary | ICD-10-CM | POA: Diagnosis not present

## 2022-12-19 DIAGNOSIS — F339 Major depressive disorder, recurrent, unspecified: Secondary | ICD-10-CM | POA: Diagnosis not present

## 2022-12-19 DIAGNOSIS — F411 Generalized anxiety disorder: Secondary | ICD-10-CM | POA: Diagnosis not present

## 2022-12-25 DIAGNOSIS — F411 Generalized anxiety disorder: Secondary | ICD-10-CM | POA: Diagnosis not present

## 2022-12-26 DIAGNOSIS — M9902 Segmental and somatic dysfunction of thoracic region: Secondary | ICD-10-CM | POA: Diagnosis not present

## 2022-12-26 DIAGNOSIS — M9905 Segmental and somatic dysfunction of pelvic region: Secondary | ICD-10-CM | POA: Diagnosis not present

## 2022-12-26 DIAGNOSIS — M9901 Segmental and somatic dysfunction of cervical region: Secondary | ICD-10-CM | POA: Diagnosis not present

## 2022-12-26 DIAGNOSIS — M9903 Segmental and somatic dysfunction of lumbar region: Secondary | ICD-10-CM | POA: Diagnosis not present

## 2023-01-08 DIAGNOSIS — F411 Generalized anxiety disorder: Secondary | ICD-10-CM | POA: Diagnosis not present

## 2023-01-15 DIAGNOSIS — F411 Generalized anxiety disorder: Secondary | ICD-10-CM | POA: Diagnosis not present

## 2023-01-16 DIAGNOSIS — M9902 Segmental and somatic dysfunction of thoracic region: Secondary | ICD-10-CM | POA: Diagnosis not present

## 2023-01-16 DIAGNOSIS — M9903 Segmental and somatic dysfunction of lumbar region: Secondary | ICD-10-CM | POA: Diagnosis not present

## 2023-01-16 DIAGNOSIS — M9901 Segmental and somatic dysfunction of cervical region: Secondary | ICD-10-CM | POA: Diagnosis not present

## 2023-01-16 DIAGNOSIS — M9905 Segmental and somatic dysfunction of pelvic region: Secondary | ICD-10-CM | POA: Diagnosis not present

## 2023-01-21 DIAGNOSIS — F411 Generalized anxiety disorder: Secondary | ICD-10-CM | POA: Diagnosis not present

## 2023-01-28 DIAGNOSIS — Z1211 Encounter for screening for malignant neoplasm of colon: Secondary | ICD-10-CM | POA: Diagnosis not present

## 2023-01-28 DIAGNOSIS — F418 Other specified anxiety disorders: Secondary | ICD-10-CM | POA: Diagnosis not present

## 2023-01-28 DIAGNOSIS — E782 Mixed hyperlipidemia: Secondary | ICD-10-CM | POA: Diagnosis not present

## 2023-01-28 DIAGNOSIS — J3089 Other allergic rhinitis: Secondary | ICD-10-CM | POA: Diagnosis not present

## 2023-01-28 DIAGNOSIS — Z0000 Encounter for general adult medical examination without abnormal findings: Secondary | ICD-10-CM | POA: Diagnosis not present

## 2023-01-28 DIAGNOSIS — E559 Vitamin D deficiency, unspecified: Secondary | ICD-10-CM | POA: Diagnosis not present

## 2023-01-29 DIAGNOSIS — M9901 Segmental and somatic dysfunction of cervical region: Secondary | ICD-10-CM | POA: Diagnosis not present

## 2023-01-29 DIAGNOSIS — M9902 Segmental and somatic dysfunction of thoracic region: Secondary | ICD-10-CM | POA: Diagnosis not present

## 2023-01-29 DIAGNOSIS — M9905 Segmental and somatic dysfunction of pelvic region: Secondary | ICD-10-CM | POA: Diagnosis not present

## 2023-01-29 DIAGNOSIS — M9903 Segmental and somatic dysfunction of lumbar region: Secondary | ICD-10-CM | POA: Diagnosis not present

## 2023-01-30 DIAGNOSIS — F411 Generalized anxiety disorder: Secondary | ICD-10-CM | POA: Diagnosis not present

## 2023-02-05 DIAGNOSIS — F411 Generalized anxiety disorder: Secondary | ICD-10-CM | POA: Diagnosis not present

## 2023-02-09 DIAGNOSIS — M9901 Segmental and somatic dysfunction of cervical region: Secondary | ICD-10-CM | POA: Diagnosis not present

## 2023-02-09 DIAGNOSIS — M9903 Segmental and somatic dysfunction of lumbar region: Secondary | ICD-10-CM | POA: Diagnosis not present

## 2023-02-09 DIAGNOSIS — M9905 Segmental and somatic dysfunction of pelvic region: Secondary | ICD-10-CM | POA: Diagnosis not present

## 2023-02-09 DIAGNOSIS — M9902 Segmental and somatic dysfunction of thoracic region: Secondary | ICD-10-CM | POA: Diagnosis not present

## 2023-02-11 DIAGNOSIS — L280 Lichen simplex chronicus: Secondary | ICD-10-CM | POA: Diagnosis not present

## 2023-02-11 DIAGNOSIS — L2089 Other atopic dermatitis: Secondary | ICD-10-CM | POA: Diagnosis not present

## 2023-02-26 DIAGNOSIS — F411 Generalized anxiety disorder: Secondary | ICD-10-CM | POA: Diagnosis not present

## 2023-02-27 DIAGNOSIS — M9902 Segmental and somatic dysfunction of thoracic region: Secondary | ICD-10-CM | POA: Diagnosis not present

## 2023-02-27 DIAGNOSIS — M9905 Segmental and somatic dysfunction of pelvic region: Secondary | ICD-10-CM | POA: Diagnosis not present

## 2023-02-27 DIAGNOSIS — M9903 Segmental and somatic dysfunction of lumbar region: Secondary | ICD-10-CM | POA: Diagnosis not present

## 2023-02-27 DIAGNOSIS — M9901 Segmental and somatic dysfunction of cervical region: Secondary | ICD-10-CM | POA: Diagnosis not present

## 2023-03-05 DIAGNOSIS — F411 Generalized anxiety disorder: Secondary | ICD-10-CM | POA: Diagnosis not present

## 2023-03-06 DIAGNOSIS — R945 Abnormal results of liver function studies: Secondary | ICD-10-CM | POA: Diagnosis not present

## 2023-03-06 DIAGNOSIS — R748 Abnormal levels of other serum enzymes: Secondary | ICD-10-CM | POA: Diagnosis not present

## 2023-03-11 DIAGNOSIS — M9901 Segmental and somatic dysfunction of cervical region: Secondary | ICD-10-CM | POA: Diagnosis not present

## 2023-03-11 DIAGNOSIS — M9903 Segmental and somatic dysfunction of lumbar region: Secondary | ICD-10-CM | POA: Diagnosis not present

## 2023-03-11 DIAGNOSIS — M9902 Segmental and somatic dysfunction of thoracic region: Secondary | ICD-10-CM | POA: Diagnosis not present

## 2023-03-11 DIAGNOSIS — M9905 Segmental and somatic dysfunction of pelvic region: Secondary | ICD-10-CM | POA: Diagnosis not present

## 2023-03-12 DIAGNOSIS — F411 Generalized anxiety disorder: Secondary | ICD-10-CM | POA: Diagnosis not present

## 2023-03-13 DIAGNOSIS — F339 Major depressive disorder, recurrent, unspecified: Secondary | ICD-10-CM | POA: Diagnosis not present

## 2023-03-13 DIAGNOSIS — F429 Obsessive-compulsive disorder, unspecified: Secondary | ICD-10-CM | POA: Diagnosis not present

## 2023-03-13 DIAGNOSIS — F411 Generalized anxiety disorder: Secondary | ICD-10-CM | POA: Diagnosis not present

## 2023-03-26 DIAGNOSIS — F411 Generalized anxiety disorder: Secondary | ICD-10-CM | POA: Diagnosis not present

## 2023-04-02 DIAGNOSIS — F411 Generalized anxiety disorder: Secondary | ICD-10-CM | POA: Diagnosis not present

## 2023-04-08 DIAGNOSIS — F411 Generalized anxiety disorder: Secondary | ICD-10-CM | POA: Diagnosis not present

## 2023-04-09 DIAGNOSIS — K649 Unspecified hemorrhoids: Secondary | ICD-10-CM | POA: Diagnosis not present

## 2023-04-09 DIAGNOSIS — Z1211 Encounter for screening for malignant neoplasm of colon: Secondary | ICD-10-CM | POA: Diagnosis not present

## 2023-04-09 DIAGNOSIS — Z83719 Family history of colon polyps, unspecified: Secondary | ICD-10-CM | POA: Diagnosis not present

## 2023-04-09 DIAGNOSIS — K5730 Diverticulosis of large intestine without perforation or abscess without bleeding: Secondary | ICD-10-CM | POA: Diagnosis not present

## 2023-04-09 DIAGNOSIS — D120 Benign neoplasm of cecum: Secondary | ICD-10-CM | POA: Diagnosis not present

## 2023-04-15 DIAGNOSIS — F411 Generalized anxiety disorder: Secondary | ICD-10-CM | POA: Diagnosis not present

## 2023-04-24 DIAGNOSIS — F411 Generalized anxiety disorder: Secondary | ICD-10-CM | POA: Diagnosis not present

## 2023-04-29 DIAGNOSIS — F411 Generalized anxiety disorder: Secondary | ICD-10-CM | POA: Diagnosis not present

## 2023-04-30 DIAGNOSIS — M9901 Segmental and somatic dysfunction of cervical region: Secondary | ICD-10-CM | POA: Diagnosis not present

## 2023-04-30 DIAGNOSIS — M9902 Segmental and somatic dysfunction of thoracic region: Secondary | ICD-10-CM | POA: Diagnosis not present

## 2023-04-30 DIAGNOSIS — M9903 Segmental and somatic dysfunction of lumbar region: Secondary | ICD-10-CM | POA: Diagnosis not present

## 2023-04-30 DIAGNOSIS — M9905 Segmental and somatic dysfunction of pelvic region: Secondary | ICD-10-CM | POA: Diagnosis not present

## 2023-05-07 DIAGNOSIS — H52203 Unspecified astigmatism, bilateral: Secondary | ICD-10-CM | POA: Diagnosis not present

## 2023-05-08 DIAGNOSIS — F411 Generalized anxiety disorder: Secondary | ICD-10-CM | POA: Diagnosis not present

## 2023-05-13 DIAGNOSIS — F411 Generalized anxiety disorder: Secondary | ICD-10-CM | POA: Diagnosis not present

## 2023-05-14 DIAGNOSIS — M9905 Segmental and somatic dysfunction of pelvic region: Secondary | ICD-10-CM | POA: Diagnosis not present

## 2023-05-14 DIAGNOSIS — M9901 Segmental and somatic dysfunction of cervical region: Secondary | ICD-10-CM | POA: Diagnosis not present

## 2023-05-14 DIAGNOSIS — M9902 Segmental and somatic dysfunction of thoracic region: Secondary | ICD-10-CM | POA: Diagnosis not present

## 2023-05-14 DIAGNOSIS — M9903 Segmental and somatic dysfunction of lumbar region: Secondary | ICD-10-CM | POA: Diagnosis not present

## 2023-05-20 DIAGNOSIS — F411 Generalized anxiety disorder: Secondary | ICD-10-CM | POA: Diagnosis not present

## 2023-05-27 DIAGNOSIS — F411 Generalized anxiety disorder: Secondary | ICD-10-CM | POA: Diagnosis not present

## 2023-05-29 DIAGNOSIS — M9902 Segmental and somatic dysfunction of thoracic region: Secondary | ICD-10-CM | POA: Diagnosis not present

## 2023-05-29 DIAGNOSIS — M9905 Segmental and somatic dysfunction of pelvic region: Secondary | ICD-10-CM | POA: Diagnosis not present

## 2023-05-29 DIAGNOSIS — M9903 Segmental and somatic dysfunction of lumbar region: Secondary | ICD-10-CM | POA: Diagnosis not present

## 2023-05-29 DIAGNOSIS — M9901 Segmental and somatic dysfunction of cervical region: Secondary | ICD-10-CM | POA: Diagnosis not present

## 2023-06-03 DIAGNOSIS — F411 Generalized anxiety disorder: Secondary | ICD-10-CM | POA: Diagnosis not present

## 2023-06-10 DIAGNOSIS — F411 Generalized anxiety disorder: Secondary | ICD-10-CM | POA: Diagnosis not present

## 2023-06-12 DIAGNOSIS — M9901 Segmental and somatic dysfunction of cervical region: Secondary | ICD-10-CM | POA: Diagnosis not present

## 2023-06-12 DIAGNOSIS — M9905 Segmental and somatic dysfunction of pelvic region: Secondary | ICD-10-CM | POA: Diagnosis not present

## 2023-06-12 DIAGNOSIS — M9903 Segmental and somatic dysfunction of lumbar region: Secondary | ICD-10-CM | POA: Diagnosis not present

## 2023-06-12 DIAGNOSIS — M9902 Segmental and somatic dysfunction of thoracic region: Secondary | ICD-10-CM | POA: Diagnosis not present

## 2023-06-15 DIAGNOSIS — R052 Subacute cough: Secondary | ICD-10-CM | POA: Diagnosis not present

## 2023-06-15 DIAGNOSIS — J301 Allergic rhinitis due to pollen: Secondary | ICD-10-CM | POA: Diagnosis not present

## 2023-06-15 DIAGNOSIS — H1045 Other chronic allergic conjunctivitis: Secondary | ICD-10-CM | POA: Diagnosis not present

## 2023-06-15 DIAGNOSIS — R21 Rash and other nonspecific skin eruption: Secondary | ICD-10-CM | POA: Diagnosis not present

## 2023-06-19 DIAGNOSIS — F411 Generalized anxiety disorder: Secondary | ICD-10-CM | POA: Diagnosis not present

## 2023-06-24 DIAGNOSIS — F411 Generalized anxiety disorder: Secondary | ICD-10-CM | POA: Diagnosis not present

## 2023-06-25 DIAGNOSIS — M9903 Segmental and somatic dysfunction of lumbar region: Secondary | ICD-10-CM | POA: Diagnosis not present

## 2023-06-25 DIAGNOSIS — M9902 Segmental and somatic dysfunction of thoracic region: Secondary | ICD-10-CM | POA: Diagnosis not present

## 2023-06-25 DIAGNOSIS — M9905 Segmental and somatic dysfunction of pelvic region: Secondary | ICD-10-CM | POA: Diagnosis not present

## 2023-06-25 DIAGNOSIS — M9901 Segmental and somatic dysfunction of cervical region: Secondary | ICD-10-CM | POA: Diagnosis not present

## 2023-07-01 DIAGNOSIS — F411 Generalized anxiety disorder: Secondary | ICD-10-CM | POA: Diagnosis not present

## 2023-07-15 DIAGNOSIS — F411 Generalized anxiety disorder: Secondary | ICD-10-CM | POA: Diagnosis not present

## 2023-07-16 DIAGNOSIS — M9901 Segmental and somatic dysfunction of cervical region: Secondary | ICD-10-CM | POA: Diagnosis not present

## 2023-07-16 DIAGNOSIS — M9903 Segmental and somatic dysfunction of lumbar region: Secondary | ICD-10-CM | POA: Diagnosis not present

## 2023-07-16 DIAGNOSIS — M9902 Segmental and somatic dysfunction of thoracic region: Secondary | ICD-10-CM | POA: Diagnosis not present

## 2023-07-16 DIAGNOSIS — M9905 Segmental and somatic dysfunction of pelvic region: Secondary | ICD-10-CM | POA: Diagnosis not present

## 2023-07-22 DIAGNOSIS — F411 Generalized anxiety disorder: Secondary | ICD-10-CM | POA: Diagnosis not present

## 2023-07-31 DIAGNOSIS — F411 Generalized anxiety disorder: Secondary | ICD-10-CM | POA: Diagnosis not present

## 2023-08-13 DIAGNOSIS — F411 Generalized anxiety disorder: Secondary | ICD-10-CM | POA: Diagnosis not present

## 2023-08-14 DIAGNOSIS — M9905 Segmental and somatic dysfunction of pelvic region: Secondary | ICD-10-CM | POA: Diagnosis not present

## 2023-08-14 DIAGNOSIS — M9901 Segmental and somatic dysfunction of cervical region: Secondary | ICD-10-CM | POA: Diagnosis not present

## 2023-08-14 DIAGNOSIS — M9902 Segmental and somatic dysfunction of thoracic region: Secondary | ICD-10-CM | POA: Diagnosis not present

## 2023-08-14 DIAGNOSIS — M9903 Segmental and somatic dysfunction of lumbar region: Secondary | ICD-10-CM | POA: Diagnosis not present

## 2023-08-19 DIAGNOSIS — B360 Pityriasis versicolor: Secondary | ICD-10-CM | POA: Diagnosis not present

## 2023-08-19 DIAGNOSIS — L2089 Other atopic dermatitis: Secondary | ICD-10-CM | POA: Diagnosis not present

## 2023-08-19 DIAGNOSIS — L280 Lichen simplex chronicus: Secondary | ICD-10-CM | POA: Diagnosis not present

## 2023-08-21 DIAGNOSIS — F411 Generalized anxiety disorder: Secondary | ICD-10-CM | POA: Diagnosis not present

## 2023-08-26 DIAGNOSIS — F411 Generalized anxiety disorder: Secondary | ICD-10-CM | POA: Diagnosis not present

## 2023-08-27 DIAGNOSIS — M9905 Segmental and somatic dysfunction of pelvic region: Secondary | ICD-10-CM | POA: Diagnosis not present

## 2023-08-27 DIAGNOSIS — M9902 Segmental and somatic dysfunction of thoracic region: Secondary | ICD-10-CM | POA: Diagnosis not present

## 2023-08-27 DIAGNOSIS — M9901 Segmental and somatic dysfunction of cervical region: Secondary | ICD-10-CM | POA: Diagnosis not present

## 2023-08-27 DIAGNOSIS — M9903 Segmental and somatic dysfunction of lumbar region: Secondary | ICD-10-CM | POA: Diagnosis not present

## 2023-09-02 DIAGNOSIS — F411 Generalized anxiety disorder: Secondary | ICD-10-CM | POA: Diagnosis not present

## 2023-09-03 DIAGNOSIS — F429 Obsessive-compulsive disorder, unspecified: Secondary | ICD-10-CM | POA: Diagnosis not present

## 2023-09-03 DIAGNOSIS — F339 Major depressive disorder, recurrent, unspecified: Secondary | ICD-10-CM | POA: Diagnosis not present

## 2023-09-03 DIAGNOSIS — F411 Generalized anxiety disorder: Secondary | ICD-10-CM | POA: Diagnosis not present

## 2023-09-09 DIAGNOSIS — F411 Generalized anxiety disorder: Secondary | ICD-10-CM | POA: Diagnosis not present

## 2023-09-17 DIAGNOSIS — M9903 Segmental and somatic dysfunction of lumbar region: Secondary | ICD-10-CM | POA: Diagnosis not present

## 2023-09-17 DIAGNOSIS — M9902 Segmental and somatic dysfunction of thoracic region: Secondary | ICD-10-CM | POA: Diagnosis not present

## 2023-09-17 DIAGNOSIS — M9905 Segmental and somatic dysfunction of pelvic region: Secondary | ICD-10-CM | POA: Diagnosis not present

## 2023-09-17 DIAGNOSIS — M9901 Segmental and somatic dysfunction of cervical region: Secondary | ICD-10-CM | POA: Diagnosis not present

## 2023-09-21 ENCOUNTER — Other Ambulatory Visit: Payer: Self-pay | Admitting: Obstetrics and Gynecology

## 2023-09-21 DIAGNOSIS — Z1231 Encounter for screening mammogram for malignant neoplasm of breast: Secondary | ICD-10-CM

## 2023-09-25 DIAGNOSIS — F411 Generalized anxiety disorder: Secondary | ICD-10-CM | POA: Diagnosis not present

## 2023-09-30 DIAGNOSIS — F411 Generalized anxiety disorder: Secondary | ICD-10-CM | POA: Diagnosis not present

## 2023-10-07 ENCOUNTER — Ambulatory Visit
Admission: RE | Admit: 2023-10-07 | Discharge: 2023-10-07 | Disposition: A | Discharge status: Home / Self Care | Payer: 59 | Source: Ambulatory Visit | Attending: Obstetrics and Gynecology | Admitting: Obstetrics and Gynecology

## 2023-10-07 DIAGNOSIS — Z1231 Encounter for screening mammogram for malignant neoplasm of breast: Secondary | ICD-10-CM | POA: Diagnosis not present

## 2023-10-09 DIAGNOSIS — M9905 Segmental and somatic dysfunction of pelvic region: Secondary | ICD-10-CM | POA: Diagnosis not present

## 2023-10-09 DIAGNOSIS — M9902 Segmental and somatic dysfunction of thoracic region: Secondary | ICD-10-CM | POA: Diagnosis not present

## 2023-10-09 DIAGNOSIS — M9903 Segmental and somatic dysfunction of lumbar region: Secondary | ICD-10-CM | POA: Diagnosis not present

## 2023-10-09 DIAGNOSIS — M9901 Segmental and somatic dysfunction of cervical region: Secondary | ICD-10-CM | POA: Diagnosis not present

## 2023-10-14 DIAGNOSIS — F411 Generalized anxiety disorder: Secondary | ICD-10-CM | POA: Diagnosis not present

## 2023-10-15 DIAGNOSIS — L308 Other specified dermatitis: Secondary | ICD-10-CM | POA: Diagnosis not present

## 2023-10-15 DIAGNOSIS — L309 Dermatitis, unspecified: Secondary | ICD-10-CM | POA: Diagnosis not present

## 2023-10-22 DIAGNOSIS — M9901 Segmental and somatic dysfunction of cervical region: Secondary | ICD-10-CM | POA: Diagnosis not present

## 2023-10-22 DIAGNOSIS — M9905 Segmental and somatic dysfunction of pelvic region: Secondary | ICD-10-CM | POA: Diagnosis not present

## 2023-10-22 DIAGNOSIS — M9903 Segmental and somatic dysfunction of lumbar region: Secondary | ICD-10-CM | POA: Diagnosis not present

## 2023-10-22 DIAGNOSIS — M9902 Segmental and somatic dysfunction of thoracic region: Secondary | ICD-10-CM | POA: Diagnosis not present

## 2023-10-28 DIAGNOSIS — F411 Generalized anxiety disorder: Secondary | ICD-10-CM | POA: Diagnosis not present

## 2023-10-29 DIAGNOSIS — Z01419 Encounter for gynecological examination (general) (routine) without abnormal findings: Secondary | ICD-10-CM | POA: Diagnosis not present

## 2023-10-29 DIAGNOSIS — N393 Stress incontinence (female) (male): Secondary | ICD-10-CM | POA: Diagnosis not present

## 2023-11-04 DIAGNOSIS — F411 Generalized anxiety disorder: Secondary | ICD-10-CM | POA: Diagnosis not present

## 2023-11-05 DIAGNOSIS — M9901 Segmental and somatic dysfunction of cervical region: Secondary | ICD-10-CM | POA: Diagnosis not present

## 2023-11-05 DIAGNOSIS — M9902 Segmental and somatic dysfunction of thoracic region: Secondary | ICD-10-CM | POA: Diagnosis not present

## 2023-11-05 DIAGNOSIS — M9905 Segmental and somatic dysfunction of pelvic region: Secondary | ICD-10-CM | POA: Diagnosis not present

## 2023-11-05 DIAGNOSIS — M9903 Segmental and somatic dysfunction of lumbar region: Secondary | ICD-10-CM | POA: Diagnosis not present

## 2023-11-11 DIAGNOSIS — F411 Generalized anxiety disorder: Secondary | ICD-10-CM | POA: Diagnosis not present

## 2023-11-19 DIAGNOSIS — M9902 Segmental and somatic dysfunction of thoracic region: Secondary | ICD-10-CM | POA: Diagnosis not present

## 2023-11-19 DIAGNOSIS — M9903 Segmental and somatic dysfunction of lumbar region: Secondary | ICD-10-CM | POA: Diagnosis not present

## 2023-11-19 DIAGNOSIS — M9901 Segmental and somatic dysfunction of cervical region: Secondary | ICD-10-CM | POA: Diagnosis not present

## 2023-11-19 DIAGNOSIS — M9905 Segmental and somatic dysfunction of pelvic region: Secondary | ICD-10-CM | POA: Diagnosis not present

## 2023-11-26 DIAGNOSIS — F411 Generalized anxiety disorder: Secondary | ICD-10-CM | POA: Diagnosis not present

## 2023-12-02 DIAGNOSIS — F411 Generalized anxiety disorder: Secondary | ICD-10-CM | POA: Diagnosis not present

## 2023-12-03 DIAGNOSIS — M9905 Segmental and somatic dysfunction of pelvic region: Secondary | ICD-10-CM | POA: Diagnosis not present

## 2023-12-03 DIAGNOSIS — M9903 Segmental and somatic dysfunction of lumbar region: Secondary | ICD-10-CM | POA: Diagnosis not present

## 2023-12-03 DIAGNOSIS — M9901 Segmental and somatic dysfunction of cervical region: Secondary | ICD-10-CM | POA: Diagnosis not present

## 2023-12-03 DIAGNOSIS — M9902 Segmental and somatic dysfunction of thoracic region: Secondary | ICD-10-CM | POA: Diagnosis not present

## 2023-12-09 DIAGNOSIS — F411 Generalized anxiety disorder: Secondary | ICD-10-CM | POA: Diagnosis not present

## 2023-12-30 DIAGNOSIS — F411 Generalized anxiety disorder: Secondary | ICD-10-CM | POA: Diagnosis not present

## 2024-01-06 DIAGNOSIS — F411 Generalized anxiety disorder: Secondary | ICD-10-CM | POA: Diagnosis not present

## 2024-01-14 DIAGNOSIS — M9901 Segmental and somatic dysfunction of cervical region: Secondary | ICD-10-CM | POA: Diagnosis not present

## 2024-01-14 DIAGNOSIS — M9902 Segmental and somatic dysfunction of thoracic region: Secondary | ICD-10-CM | POA: Diagnosis not present

## 2024-01-14 DIAGNOSIS — M9903 Segmental and somatic dysfunction of lumbar region: Secondary | ICD-10-CM | POA: Diagnosis not present

## 2024-01-14 DIAGNOSIS — M9905 Segmental and somatic dysfunction of pelvic region: Secondary | ICD-10-CM | POA: Diagnosis not present

## 2024-01-20 DIAGNOSIS — F411 Generalized anxiety disorder: Secondary | ICD-10-CM | POA: Diagnosis not present

## 2024-01-28 DIAGNOSIS — M9903 Segmental and somatic dysfunction of lumbar region: Secondary | ICD-10-CM | POA: Diagnosis not present

## 2024-01-28 DIAGNOSIS — M9905 Segmental and somatic dysfunction of pelvic region: Secondary | ICD-10-CM | POA: Diagnosis not present

## 2024-01-28 DIAGNOSIS — M9901 Segmental and somatic dysfunction of cervical region: Secondary | ICD-10-CM | POA: Diagnosis not present

## 2024-01-28 DIAGNOSIS — M9902 Segmental and somatic dysfunction of thoracic region: Secondary | ICD-10-CM | POA: Diagnosis not present

## 2024-02-03 DIAGNOSIS — F411 Generalized anxiety disorder: Secondary | ICD-10-CM | POA: Diagnosis not present

## 2024-02-10 DIAGNOSIS — F411 Generalized anxiety disorder: Secondary | ICD-10-CM | POA: Diagnosis not present

## 2024-02-11 DIAGNOSIS — E7800 Pure hypercholesterolemia, unspecified: Secondary | ICD-10-CM | POA: Diagnosis not present

## 2024-02-11 DIAGNOSIS — E559 Vitamin D deficiency, unspecified: Secondary | ICD-10-CM | POA: Diagnosis not present

## 2024-02-11 DIAGNOSIS — Z0000 Encounter for general adult medical examination without abnormal findings: Secondary | ICD-10-CM | POA: Diagnosis not present

## 2024-02-11 DIAGNOSIS — R635 Abnormal weight gain: Secondary | ICD-10-CM | POA: Diagnosis not present

## 2024-02-24 DIAGNOSIS — L2089 Other atopic dermatitis: Secondary | ICD-10-CM | POA: Diagnosis not present

## 2024-02-24 DIAGNOSIS — L280 Lichen simplex chronicus: Secondary | ICD-10-CM | POA: Diagnosis not present

## 2024-02-25 DIAGNOSIS — M9902 Segmental and somatic dysfunction of thoracic region: Secondary | ICD-10-CM | POA: Diagnosis not present

## 2024-02-25 DIAGNOSIS — M9905 Segmental and somatic dysfunction of pelvic region: Secondary | ICD-10-CM | POA: Diagnosis not present

## 2024-02-25 DIAGNOSIS — M9903 Segmental and somatic dysfunction of lumbar region: Secondary | ICD-10-CM | POA: Diagnosis not present

## 2024-02-25 DIAGNOSIS — M9901 Segmental and somatic dysfunction of cervical region: Secondary | ICD-10-CM | POA: Diagnosis not present

## 2024-03-02 DIAGNOSIS — F411 Generalized anxiety disorder: Secondary | ICD-10-CM | POA: Diagnosis not present

## 2024-03-02 DIAGNOSIS — F429 Obsessive-compulsive disorder, unspecified: Secondary | ICD-10-CM | POA: Diagnosis not present

## 2024-03-02 DIAGNOSIS — F339 Major depressive disorder, recurrent, unspecified: Secondary | ICD-10-CM | POA: Diagnosis not present

## 2024-03-04 DIAGNOSIS — F411 Generalized anxiety disorder: Secondary | ICD-10-CM | POA: Diagnosis not present

## 2024-03-10 DIAGNOSIS — M9901 Segmental and somatic dysfunction of cervical region: Secondary | ICD-10-CM | POA: Diagnosis not present

## 2024-03-10 DIAGNOSIS — M9902 Segmental and somatic dysfunction of thoracic region: Secondary | ICD-10-CM | POA: Diagnosis not present

## 2024-03-10 DIAGNOSIS — M9905 Segmental and somatic dysfunction of pelvic region: Secondary | ICD-10-CM | POA: Diagnosis not present

## 2024-03-10 DIAGNOSIS — M9903 Segmental and somatic dysfunction of lumbar region: Secondary | ICD-10-CM | POA: Diagnosis not present

## 2024-03-18 DIAGNOSIS — R221 Localized swelling, mass and lump, neck: Secondary | ICD-10-CM | POA: Diagnosis not present

## 2024-03-18 DIAGNOSIS — F411 Generalized anxiety disorder: Secondary | ICD-10-CM | POA: Diagnosis not present

## 2024-03-18 DIAGNOSIS — E049 Nontoxic goiter, unspecified: Secondary | ICD-10-CM | POA: Diagnosis not present

## 2024-03-23 DIAGNOSIS — F411 Generalized anxiety disorder: Secondary | ICD-10-CM | POA: Diagnosis not present

## 2024-03-24 DIAGNOSIS — M9902 Segmental and somatic dysfunction of thoracic region: Secondary | ICD-10-CM | POA: Diagnosis not present

## 2024-03-24 DIAGNOSIS — M9903 Segmental and somatic dysfunction of lumbar region: Secondary | ICD-10-CM | POA: Diagnosis not present

## 2024-03-24 DIAGNOSIS — M9901 Segmental and somatic dysfunction of cervical region: Secondary | ICD-10-CM | POA: Diagnosis not present

## 2024-03-24 DIAGNOSIS — M9905 Segmental and somatic dysfunction of pelvic region: Secondary | ICD-10-CM | POA: Diagnosis not present

## 2024-04-01 DIAGNOSIS — F411 Generalized anxiety disorder: Secondary | ICD-10-CM | POA: Diagnosis not present

## 2024-04-06 DIAGNOSIS — F411 Generalized anxiety disorder: Secondary | ICD-10-CM | POA: Diagnosis not present

## 2024-04-13 DIAGNOSIS — F411 Generalized anxiety disorder: Secondary | ICD-10-CM | POA: Diagnosis not present

## 2024-04-14 DIAGNOSIS — M9903 Segmental and somatic dysfunction of lumbar region: Secondary | ICD-10-CM | POA: Diagnosis not present

## 2024-04-14 DIAGNOSIS — M9902 Segmental and somatic dysfunction of thoracic region: Secondary | ICD-10-CM | POA: Diagnosis not present

## 2024-04-14 DIAGNOSIS — M9905 Segmental and somatic dysfunction of pelvic region: Secondary | ICD-10-CM | POA: Diagnosis not present

## 2024-04-14 DIAGNOSIS — M9901 Segmental and somatic dysfunction of cervical region: Secondary | ICD-10-CM | POA: Diagnosis not present

## 2024-04-20 DIAGNOSIS — F411 Generalized anxiety disorder: Secondary | ICD-10-CM | POA: Diagnosis not present

## 2024-04-27 DIAGNOSIS — F411 Generalized anxiety disorder: Secondary | ICD-10-CM | POA: Diagnosis not present

## 2024-04-28 DIAGNOSIS — M9905 Segmental and somatic dysfunction of pelvic region: Secondary | ICD-10-CM | POA: Diagnosis not present

## 2024-04-28 DIAGNOSIS — M9902 Segmental and somatic dysfunction of thoracic region: Secondary | ICD-10-CM | POA: Diagnosis not present

## 2024-04-28 DIAGNOSIS — M9903 Segmental and somatic dysfunction of lumbar region: Secondary | ICD-10-CM | POA: Diagnosis not present

## 2024-04-28 DIAGNOSIS — M9901 Segmental and somatic dysfunction of cervical region: Secondary | ICD-10-CM | POA: Diagnosis not present

## 2024-05-04 DIAGNOSIS — F411 Generalized anxiety disorder: Secondary | ICD-10-CM | POA: Diagnosis not present

## 2024-05-11 DIAGNOSIS — F411 Generalized anxiety disorder: Secondary | ICD-10-CM | POA: Diagnosis not present
